# Patient Record
Sex: Female | Born: 1985 | Hispanic: Yes | Marital: Single | Smoking: Never smoker
Health system: Southern US, Community
[De-identification: ages and names within clinical notes are randomized; demographics above are authoritative.]

## PROBLEM LIST (undated history)

## (undated) DIAGNOSIS — Z789 Other specified health status: Secondary | ICD-10-CM

---

## 2007-05-08 ENCOUNTER — Inpatient Hospital Stay: Payer: Self-pay | Admitting: Obstetrics and Gynecology

## 2010-05-11 ENCOUNTER — Emergency Department: Payer: Self-pay | Admitting: Emergency Medicine

## 2010-06-21 ENCOUNTER — Emergency Department: Payer: Self-pay | Admitting: Emergency Medicine

## 2010-08-04 ENCOUNTER — Encounter: Payer: Self-pay | Admitting: Maternal and Fetal Medicine

## 2010-10-20 IMAGING — US US OB < 14 WEEKS
1 series · 17 of 28 positions shown · non-contrast
Comparison: None

REASON FOR EXAM: vaginal bleed
COMMENTS:   LMP: 11 weeks preg

PROCEDURE:     US  - US OB LESS THAN 14 WEEKS  - June 21, 2010  [DATE]
RESULT:     Indication: Vaginal bleeding. LMP 01/27/2010
TECHNIQUE: Multiple transabdominal gray-scale images  of the pelvis
performed.

[Series 1: us ob < 14 weeks · 17 of 40 slices shown]
[im 1/40]
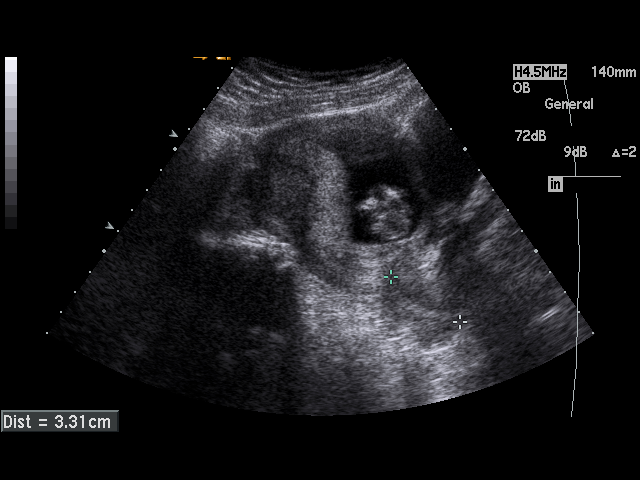
[im 3/40]
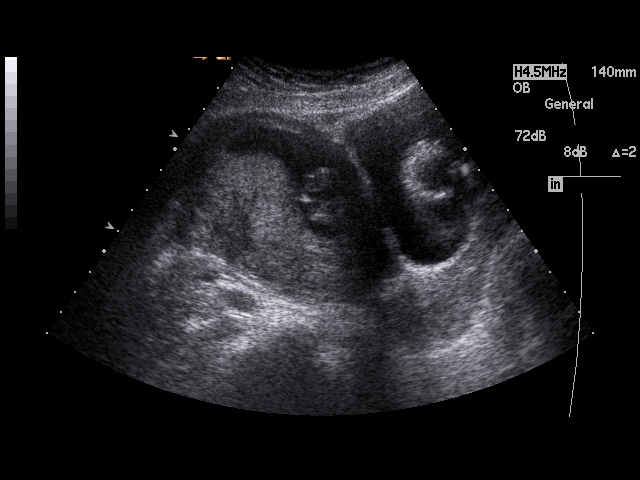
[im 6/40]
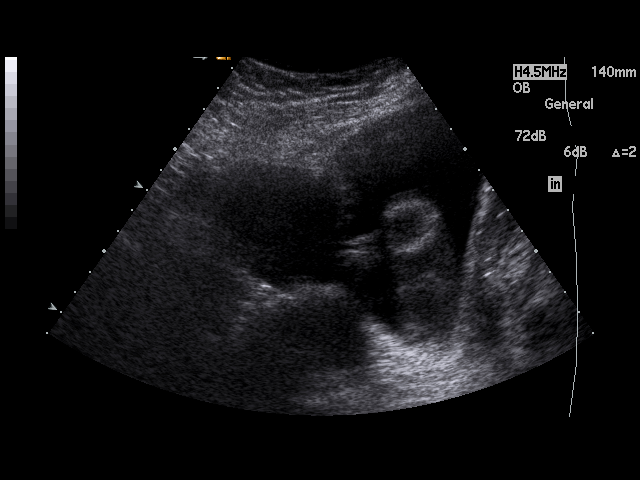
[im 8/40]
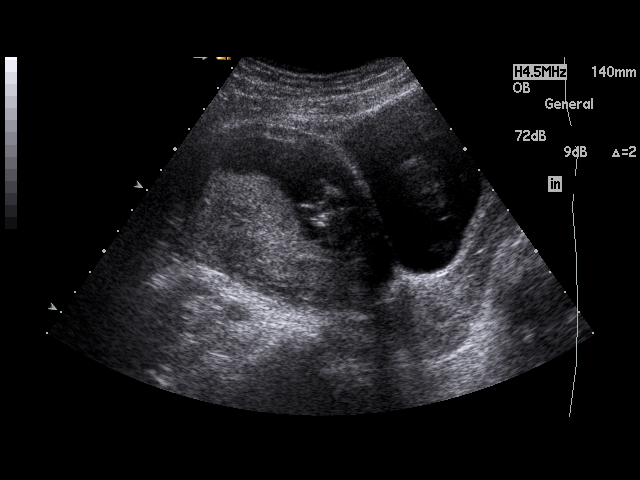
[im 11/40]
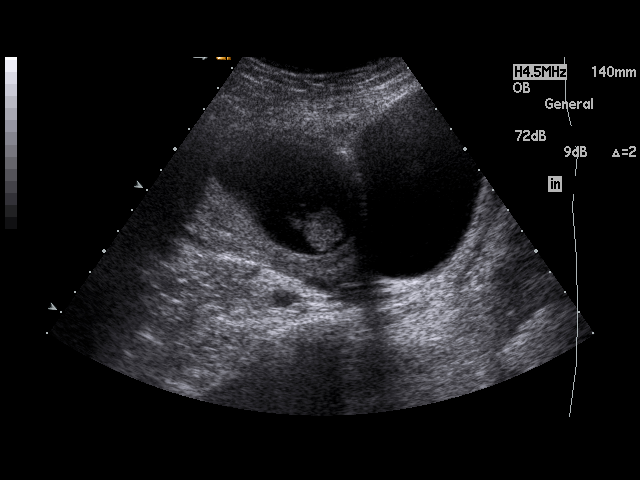
[im 14/40]
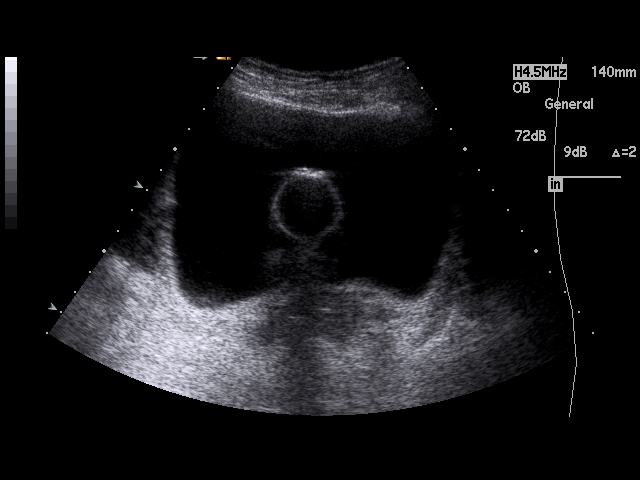
[im 15/40]
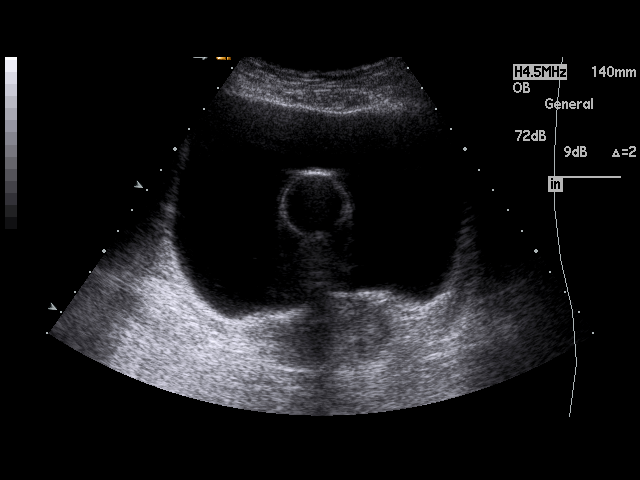
[im 18/40]
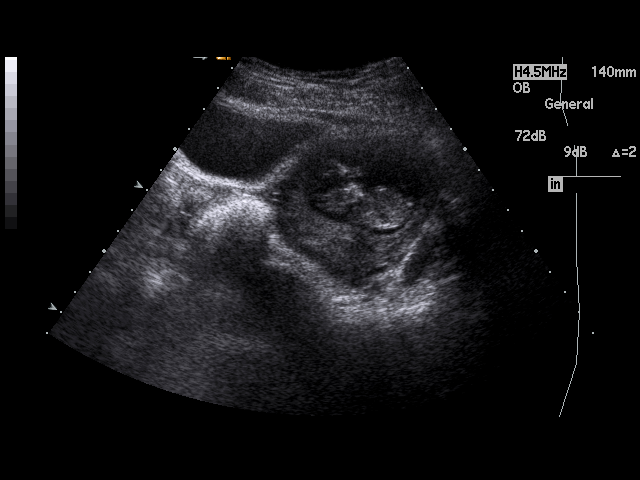
[im 21/40]
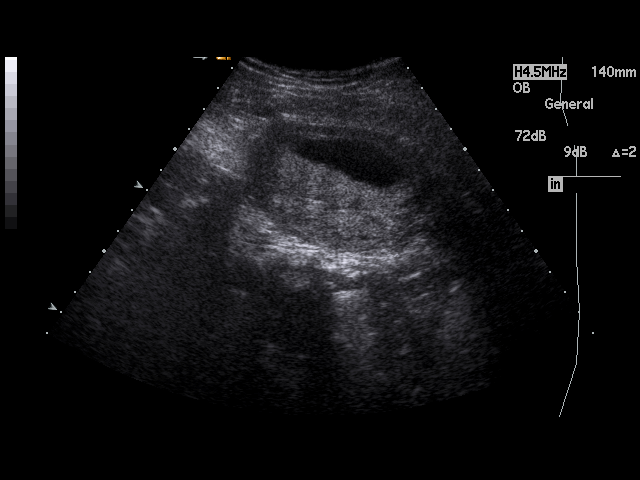
[im 22/40]
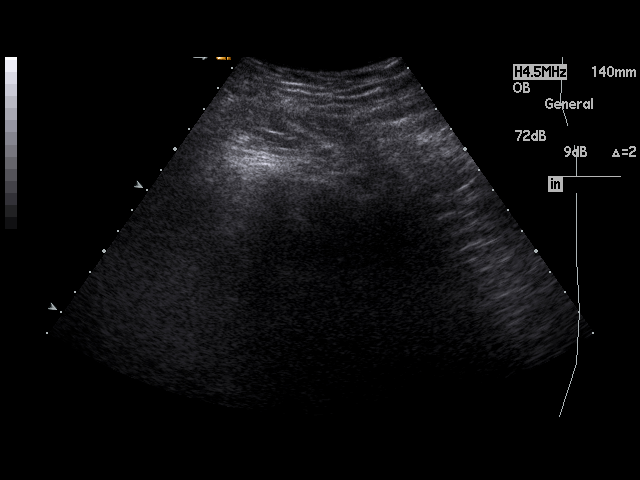
[im 25/40]
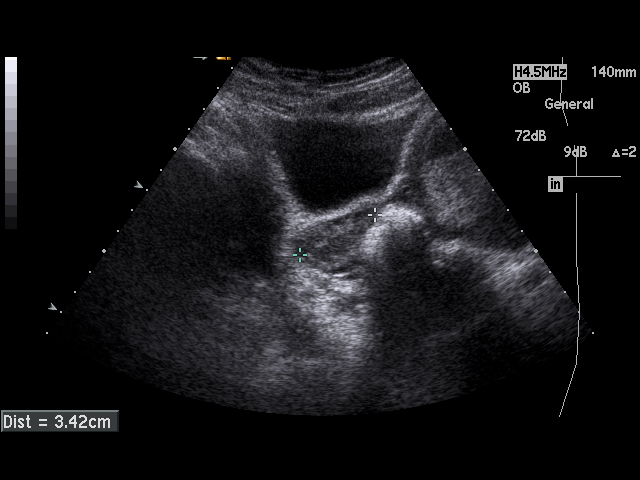
[im 27/40]
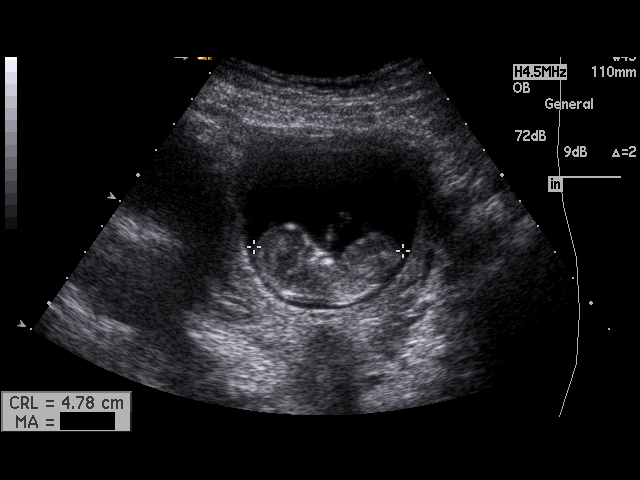
[im 29/40]
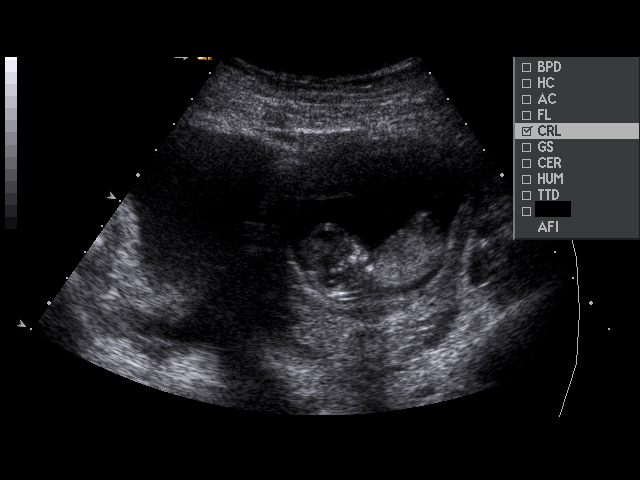
[im 32/40]
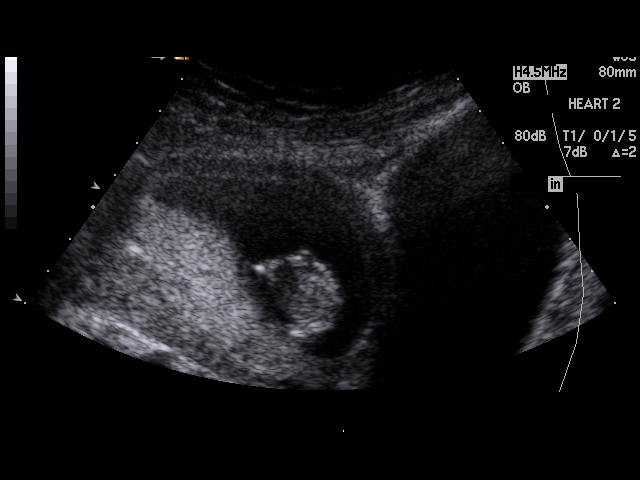
[im 34/40]
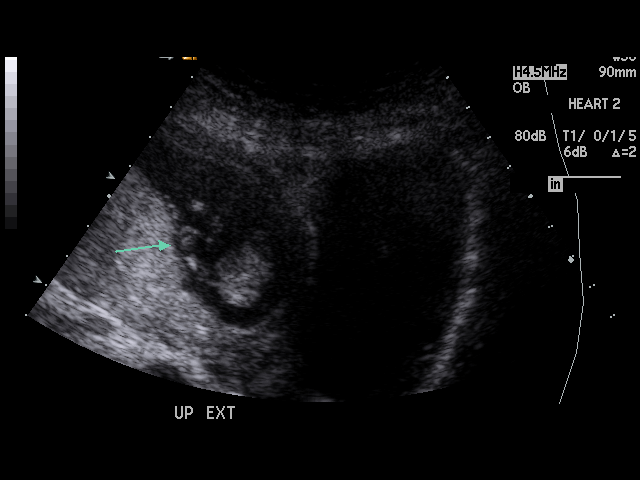
[im 37/40]
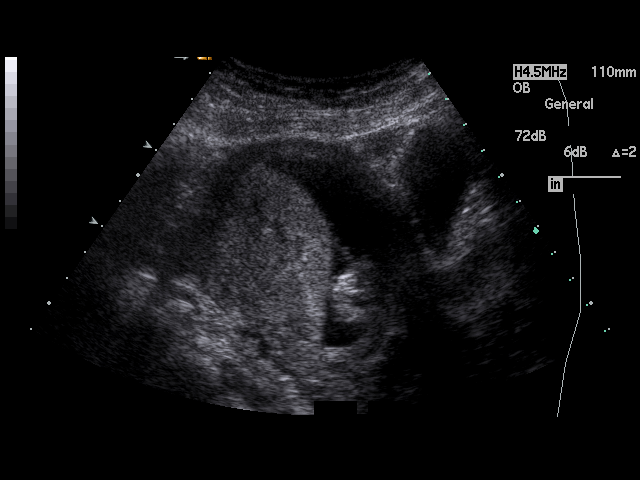
[im 40/40]
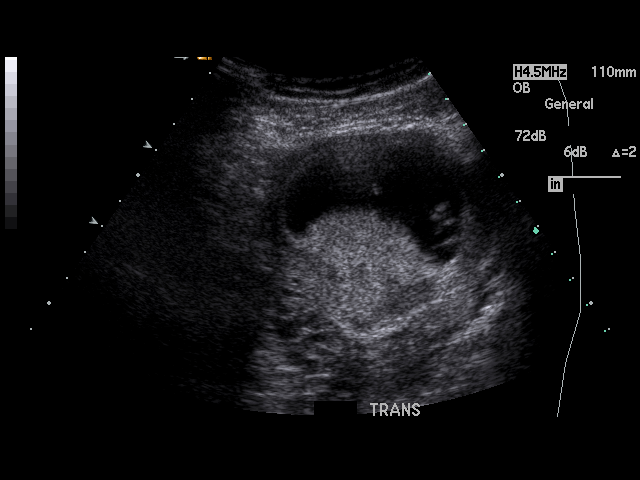

[17 of 28 positions shown; findings below may reference images not displayed]

FINDINGS: There is a single live intrauterine pregnancy visualized with a crown-rump
length of 4.71 cm dating the pregnancy at 11 weeks 3 days. There is a normal
yolk sac. There is a normal fetal heart rate of 163 beats per minute. The
cervix is normal in length measuring 3.4 cm and closed.

The right ovary measures 3.4 x 3.6 x 2.1 cm.  The left ovary measures 2.6 x
2.2 x 2.9 cm.  There is no adnexal mass.

There is no pelvic free fluid.
IMPRESSION: Single live intrauterine pregnancy dating 11 weeks 3 days with an estimated
due date of 01/07/2011.

## 2010-10-27 ENCOUNTER — Encounter: Payer: Self-pay | Admitting: Maternal and Fetal Medicine

## 2010-12-07 ENCOUNTER — Inpatient Hospital Stay: Payer: Self-pay

## 2016-07-28 ENCOUNTER — Encounter: Payer: Self-pay | Admitting: Emergency Medicine

## 2016-07-28 ENCOUNTER — Emergency Department
Admission: EM | Admit: 2016-07-28 | Discharge: 2016-07-28 | Disposition: A | Discharge status: Home / Self Care | Payer: BLUE CROSS/BLUE SHIELD | Attending: Emergency Medicine | Admitting: Emergency Medicine

## 2016-07-28 DIAGNOSIS — R300 Dysuria: Secondary | ICD-10-CM | POA: Diagnosis present

## 2016-07-28 DIAGNOSIS — N3000 Acute cystitis without hematuria: Secondary | ICD-10-CM | POA: Insufficient documentation

## 2016-07-28 LAB — URINALYSIS COMPLETE WITH MICROSCOPIC (ARMC ONLY)
Bilirubin Urine: NEGATIVE
Glucose, UA: NEGATIVE mg/dL
Ketones, ur: NEGATIVE mg/dL
NITRITE: NEGATIVE
PH: 6 (ref 5.0–8.0)
PROTEIN: 100 mg/dL — AB
SPECIFIC GRAVITY, URINE: 1.003 — AB (ref 1.005–1.030)

## 2016-07-28 LAB — POCT PREGNANCY, URINE: Preg Test, Ur: NEGATIVE

## 2016-07-28 MED ORDER — PHENAZOPYRIDINE HCL 100 MG PO TABS: 100.0000 mg | ORAL_TABLET | Freq: Three times a day (TID) | ORAL | 0 refills | Status: AC | PRN

## 2016-07-28 MED ORDER — PHENAZOPYRIDINE HCL 100 MG PO TABS
95.0000 mg | ORAL_TABLET | Freq: Once | ORAL | Status: AC
  Administered 2016-07-28: 100 mg via ORAL
  Filled 2016-07-28: qty 1

## 2016-07-28 MED ORDER — CIPROFLOXACIN HCL 500 MG PO TABS: 500.0000 mg | ORAL_TABLET | Freq: Two times a day (BID) | ORAL | 0 refills | Status: AC

## 2016-07-28 MED ORDER — CIPROFLOXACIN HCL 500 MG PO TABS
500.0000 mg | ORAL_TABLET | Freq: Once | ORAL | Status: AC
  Administered 2016-07-28: 500 mg via ORAL
  Filled 2016-07-28: qty 1

## 2016-07-28 NOTE — ED Notes (Signed)
Pt discharged to home.  Discharge instructions reviewed.  Verbalized understanding.  No questions or concerns at this time.  Teach back verified.  Pt in NAD.  No items left in ED.   

## 2016-07-28 NOTE — ED Triage Notes (Signed)
Patient ambulatory to triage with steady gait, without difficulty or distress noted; pt reports dysuria this evening

## 2016-07-28 NOTE — ED Provider Notes (Signed)
[facility] Emergency Department Provider Note    First MD Initiated Contact with Patient 07/28/16 [phone]     (approximate)  I have reviewed the triage vital signs and the nursing notes.   HISTORY  Chief Complaint Dysuria    HPI Monica Wilcox is a 30 y.o. female presents with dysuria or urinary frequency with onset yesterday evening. Patient denies any back pain no fever afebrile on presentation with temperature 98.1. Patient denies any vaginal discharge.   Past medical history None There are no active problems to display for this patient.   Past surgical history None  Prior to Admission medications   Not on File    Allergies Review of patient's allergies indicates no known allergies.  No family history on file.  Social History Social History  Substance Use Topics  . Smoking status: Never Smoker  . Smokeless tobacco: Never Used  . Alcohol use No    Review of Systems Constitutional: No fever/chills Eyes: No visual changes. ENT: No sore throat. Cardiovascular: Denies chest pain. Respiratory: Denies shortness of breath. Gastrointestinal: No abdominal pain.  No nausea, no vomiting.  No diarrhea.  No constipation. Genitourinary: Positive for dysuria. Musculoskeletal: Negative for back pain. Skin: Negative for rash. Neurological: Negative for headaches, focal weakness or numbness.  10-point ROS otherwise negative.  ____________________________________________   PHYSICAL EXAM:  VITAL SIGNS: ED Triage Vitals  Enc Vitals Group     BP 07/28/16 0124 140/85     Pulse Rate 07/28/16 0124 79     Resp 07/28/16 0124 18     Temp 07/28/16 0124 98.1 F (36.7 C)     Temp Source 07/28/16 0124 Oral     SpO2 07/28/16 0124 99 %     Weight 07/28/16 0123 132 lb (59.9 kg)     Height 07/28/16 0123 5' (1.524 m)     Head Circumference --      Peak Flow --      Pain Score 07/28/16 0123 9     Pain Loc --      Pain Edu? --      Excl. in [place]? --      Constitutional: Alert and oriented. Well appearing and in no acute distress. Eyes: Conjunctivae are normal. PERRL. EOMI. Head: Atraumatic. Mouth/Throat: Mucous membranes are moist.  Oropharynx non-erythematous. Cardiovascular: Normal rate, regular rhythm. Good peripheral circulation. Grossly normal heart sounds. Respiratory: Normal respiratory effort.  No retractions. Lungs CTAB. Gastrointestinal: Soft and nontender. No distention.  Musculoskeletal: No lower extremity tenderness nor edema. No gross deformities of extremities. Neurologic:  Normal speech and language. No gross focal neurologic deficits are appreciated.  Skin:  Skin is warm, dry and intact. No rash noted. Psychiatric: Mood and affect are normal. Speech and behavior are normal.  ____________________________________________   LABS (all labs ordered are listed, but only abnormal results are displayed)  Labs Reviewed  URINALYSIS COMPLETEWITH MICROSCOPIC ([facility] ONLY) - Abnormal; Notable for the following:       Result Value   Color, Urine YELLOW (*)    APPearance CLEAR (*)    Specific Gravity, Urine 1.003 (*)    Hgb urine dipstick 3+ (*)    Protein, ur 100 (*)    Leukocytes, UA 2+ (*)    Bacteria, UA RARE (*)    Squamous Epithelial / LPF 0-5 (*)    All other components within normal limits  POCT PREGNANCY, URINE    Procedures   INITIAL IMPRESSION / ASSESSMENT AND PLAN / ED COURSE  Pertinent labs & imaging results that were available during my care of the patient were reviewed by me and considered in my medical decision making (see chart for details).  Patient given Cipro in emergency Department will be prescribed some for home. In addition patient given Pyridium   Clinical Course    ____________________________________________  FINAL CLINICAL IMPRESSION(S) / ED DIAGNOSES  Final diagnoses:  Acute cystitis without hematuria     MEDICATIONS GIVEN DURING THIS VISIT:  Medications  ciprofloxacin  (CIPRO) tablet 500 mg (not administered)  phenazopyridine (PYRIDIUM) tablet 100 mg (not administered)     NEW OUTPATIENT MEDICATIONS STARTED DURING THIS VISIT:  New Prescriptions   No medications on file    Modified Medications   No medications on file    Discontinued Medications   No medications on file     Note:  This document was prepared using Dragon voice recognition software and may include unintentional dictation errors.    Darci Current, MD 07/28/16 [phone]

## 2017-09-06 ENCOUNTER — Encounter: Payer: Self-pay | Admitting: Urology

## 2017-09-06 ENCOUNTER — Ambulatory Visit: Payer: BLUE CROSS/BLUE SHIELD | Admitting: Urology

## 2017-09-06 VITALS — BP 127/81 | HR 58 | Ht 60.0 in | Wt 154.0 lb

## 2017-09-06 DIAGNOSIS — N390 Urinary tract infection, site not specified: Secondary | ICD-10-CM

## 2017-09-06 LAB — MICROSCOPIC EXAMINATION: WBC UA: NONE SEEN /HPF (ref 0–?)

## 2017-09-06 LAB — URINALYSIS, COMPLETE
Bilirubin, UA: NEGATIVE
GLUCOSE, UA: NEGATIVE
Ketones, UA: NEGATIVE
Leukocytes, UA: NEGATIVE
Nitrite, UA: NEGATIVE
PROTEIN UA: NEGATIVE
Specific Gravity, UA: 1.025 (ref 1.005–1.030)
UUROB: 0.2 mg/dL (ref 0.2–1.0)
pH, UA: 5.5 (ref 5.0–7.5)

## 2017-09-06 MED ORDER — MELOXICAM 15 MG PO TABS: 15.0000 mg | ORAL_TABLET | Freq: Every day | ORAL | 11 refills | Status: DC

## 2017-09-06 NOTE — Progress Notes (Signed)
09/06/2017 1:46 PM   Monica Wilcox 1985/12/25 [id number]  Referring provider: Christeen DouglasBeasley, [place], MD [address] [place], [place] [address]  Chief Complaint  Patient presents with  . Recurrent UTI    New Patient    HPI: I was consulted to assess the patient's 3820-month history of burning.  She describes frequency gross hematuria and a lot of pain with urination 2 months ago.  10 days afterwards she had burning and was treated for a yeast infection and and subsequently a bladder infection with antibiotics.  She still has dysuria.  She has dyspareunia new onset as well.  She voids every several hours and sometimes gets up once a night to urinate  She denies a history of kidney stones previous GU surgery and urinary tract infections.  She has not had a hysterectomy.  She has no neurologic issues.  Bowel movements are normal  Modifying factors: There are no other modifying factors  Associated signs and symptoms: There are no other associated signs and symptoms Aggravating and relieving factors: There are no other aggravating or relieving factors Severity: Moderate Duration: Persistent   PMH: History reviewed. No pertinent past medical history.  Surgical History: History reviewed. No pertinent surgical history.  Home Medications:  Allergies as of 09/06/2017   No Known Allergies     Medication List        Accurate as of 09/06/17  1:46 PM. Always use your most recent med list.          levonorgestrel 20 MCG/24HR IUD Commonly known as:  MIRENA by Intrauterine route.       Allergies: No Known Allergies  Family History: Family History  Problem Relation Age of Onset  . Bladder Cancer Neg Hx   . Kidney cancer Neg Hx     Social History:  reports that  has never smoked. she has never used smokeless tobacco. She reports that she does not drink alcohol or use drugs.  ROS: UROLOGY Frequent Urination?: No Hard to postpone urination?: No Burning/pain with urination?:  Yes Get up at night to urinate?: No Leakage of urine?: No Urine stream starts and stops?: No Trouble starting stream?: No Do you have to strain to urinate?: No Blood in urine?: Yes Urinary tract infection?: Yes Sexually transmitted disease?: No Injury to kidneys or bladder?: No Painful intercourse?: Yes Weak stream?: No Currently pregnant?: No Vaginal bleeding?: No Last menstrual period?: n  Gastrointestinal Nausea?: No Vomiting?: No Indigestion/heartburn?: No Diarrhea?: No Constipation?: No  Constitutional Fever: No Night sweats?: No Weight loss?: No Fatigue?: No  Skin Skin rash/lesions?: No Itching?: No  Eyes Blurred vision?: No Double vision?: No  Ears/Nose/Throat Sore throat?: No Sinus problems?: No  Hematologic/Lymphatic Swollen glands?: No Easy bruising?: No  Cardiovascular Leg swelling?: No Chest pain?: No  Respiratory Cough?: No Shortness of breath?: No  Endocrine Excessive thirst?: No  Musculoskeletal Back pain?: No Joint pain?: No  Neurological Headaches?: No Dizziness?: No  Psychologic Depression?: No Anxiety?: No  Physical Exam: BP 127/81 (BP Location: Right Arm, Patient Position: Sitting, Cuff Size: Normal)   Pulse (!) 58   Ht 5' (1.524 m)   Wt 154 lb (69.9 kg)   BMI 30.08 kg/m   Constitutional:  Alert and oriented, No acute distress. HEENT: [place] AT, moist mucus membranes.  Trachea midline, no masses. Cardiovascular: No clubbing, cyanosis, or edema. Respiratory: Normal respiratory effort, no increased work of breathing. GI: Abdomen is soft, nontender, nondistended, no abdominal masses GU: On pelvic examination the string  from the IUD appeared to be normal with no associated cervicitis or abnormality.  She had no abnormal vaginal discharge.  She had a little bit of burning when I touched her levator muscles and bladder.  She had no prolapse or stress incontinence Skin: No rashes, bruises or suspicious lesions. Lymph: No  cervical or inguinal adenopathy. Neurologic: Grossly intact, no focal deficits, moving all 4 extremities. Psychiatric: Normal mood and affect.  Laboratory Data:  Urinalysis    Component Value Date/Time   COLORURINE YELLOW (A) 07/28/2016 0127   APPEARANCEUR CLEAR (A) 07/28/2016 0127   LABSPEC 1.003 (L) 07/28/2016 0127   PHURINE 6.0 07/28/2016 0127   GLUCOSEU NEGATIVE 07/28/2016 0127   HGBUR 3+ (A) 07/28/2016 0127   BILIRUBINUR NEGATIVE 07/28/2016 0127   KETONESUR NEGATIVE 07/28/2016 0127   PROTEINUR 100 (A) 07/28/2016 0127   NITRITE NEGATIVE 07/28/2016 0127   LEUKOCYTESUR 2+ (A) 07/28/2016 0127    Pertinent Imaging: none  Assessment & Plan: With a history of hematuria the patient may have a post urinary tract infection hypersensitivity with dysuria.  I think it very reasonable to have the patient evaluated with a hematuria CT scan recognizing she is only 31 and perform cystoscopy.  I think it is too early to consider interstitial cystitis.  She may have a urethritis.  Placing the patient on prophylaxis and bladder irritant diet would be prudent and reevaluate.  I do not recommend a hydrodistention yet.  She had no microscopic hematuria today.  I will have her initially seen by 1 of my partners to expedite her visit.  I did discuss post urinary tract infection hypersensitivity and urethritis and mentioned interstitial cystitis today.  Again recognizing sometimes its limitations I gave her meloxicam 15 mg a day for 14 days.  1. Recurrent UTI Chronic cystitis - Urinalysis, Complete   No Follow-up on file.  Martina SinnerMACDIARMID,Ariston Grandison A, MD  [facility] [address], [address] [place], [place] [address] [phone]

## 2017-09-08 LAB — CULTURE, URINE COMPREHENSIVE

## 2017-09-13 ENCOUNTER — Ambulatory Visit: Payer: Self-pay

## 2017-09-15 ENCOUNTER — Ambulatory Visit
Admission: RE | Admit: 2017-09-15 | Discharge: 2017-09-15 | Disposition: A | Discharge status: Home / Self Care | Payer: BLUE CROSS/BLUE SHIELD | Source: Ambulatory Visit | Attending: Urology | Admitting: Urology

## 2017-09-15 DIAGNOSIS — M5137 Other intervertebral disc degeneration, lumbosacral region: Secondary | ICD-10-CM | POA: Insufficient documentation

## 2017-09-15 DIAGNOSIS — N390 Urinary tract infection, site not specified: Secondary | ICD-10-CM | POA: Diagnosis present

## 2017-09-15 DIAGNOSIS — M5136 Other intervertebral disc degeneration, lumbar region: Secondary | ICD-10-CM | POA: Insufficient documentation

## 2017-09-15 DIAGNOSIS — M4185 Other forms of scoliosis, thoracolumbar region: Secondary | ICD-10-CM | POA: Insufficient documentation

## 2017-09-15 MED ORDER — IOPAMIDOL (ISOVUE-300) INJECTION 61%
125.0000 mL | Freq: Once | INTRAVENOUS | Status: AC | PRN
  Administered 2017-09-15: 125 mL via INTRAVENOUS

## 2017-09-16 ENCOUNTER — Ambulatory Visit: Payer: BLUE CROSS/BLUE SHIELD

## 2017-09-30 ENCOUNTER — Ambulatory Visit (INDEPENDENT_AMBULATORY_CARE_PROVIDER_SITE_OTHER): Payer: BLUE CROSS/BLUE SHIELD | Admitting: Urology

## 2017-09-30 ENCOUNTER — Encounter: Payer: Self-pay | Admitting: Urology

## 2017-09-30 VITALS — BP 126/70 | HR 67 | Ht 60.0 in | Wt 154.0 lb

## 2017-09-30 DIAGNOSIS — R3129 Other microscopic hematuria: Secondary | ICD-10-CM | POA: Diagnosis not present

## 2017-09-30 LAB — MICROSCOPIC EXAMINATION: WBC, UA: NONE SEEN /hpf (ref 0–?)

## 2017-09-30 LAB — URINALYSIS, COMPLETE
Bilirubin, UA: NEGATIVE
GLUCOSE, UA: NEGATIVE
Ketones, UA: NEGATIVE
LEUKOCYTES UA: NEGATIVE
Nitrite, UA: NEGATIVE
PH UA: 6.5 (ref 5.0–7.5)
PROTEIN UA: NEGATIVE
Specific Gravity, UA: 1.02 (ref 1.005–1.030)
UUROB: 0.2 mg/dL (ref 0.2–1.0)

## 2017-09-30 MED ORDER — SOLIFENACIN SUCCINATE 5 MG PO TABS: 5.0000 mg | ORAL_TABLET | Freq: Every day | ORAL | 2 refills | Status: DC

## 2017-09-30 MED ORDER — LIDOCAINE HCL 2 % EX GEL
1.0000 "application " | Freq: Once | CUTANEOUS | Status: AC
  Administered 2017-09-30: 1 via URETHRAL

## 2017-09-30 MED ORDER — CIPROFLOXACIN HCL 500 MG PO TABS
500.0000 mg | ORAL_TABLET | Freq: Once | ORAL | Status: AC
  Administered 2017-09-30: 500 mg via ORAL

## 2017-09-30 NOTE — Progress Notes (Signed)
   09/30/17  CC:  Chief Complaint  Patient presents with  . Cysto    HPI: Refer to Dr. Mina MarbleMacDiarmid's note of 09/06/2017.  She saw no improvement on meloxicam.  CT urogram shows no significant genitourinary abnormalities.  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 Blood pressure 126/70, pulse 67, height 5' (1.524 m), weight 154 lb (69.9 kg). NED. A&Ox3.   No respiratory distress   Abd soft, NT, ND Normal external genitalia with patent urethral meatus  Cystoscopy Procedure Note  Patient identification was confirmed, informed consent was obtained, and patient was prepped using Betadine solution.  Lidocaine jelly was administered per urethral meatus.    Preoperative abx where received prior to procedure.    Procedure: - Flexible cystoscope introduced, without any difficulty.   - Thorough search of the bladder revealed:    normal urethral meatus    normal urothelium    no stones    no ulcers     no tumors    no urethral polyps    no trabeculation  - Ureteral orifices were normal in position and appearance.  Post-Procedure: - Patient tolerated the procedure well  Assessment/ Plan: No significant abnormalities noted on cystoscopy.  Her pain has improved although she still has bothersome storage related symptoms.  Trial of Vesicare 5 mg daily.   Riki AltesScott C Loula Marcella, MD

## 2017-11-25 ENCOUNTER — Ambulatory Visit: Payer: BLUE CROSS/BLUE SHIELD | Admitting: Urology

## 2017-11-29 ENCOUNTER — Ambulatory Visit: Payer: BLUE CROSS/BLUE SHIELD | Admitting: Urology

## 2019-03-21 ENCOUNTER — Other Ambulatory Visit: Payer: BLUE CROSS/BLUE SHIELD

## 2019-03-21 ENCOUNTER — Telehealth: Payer: Self-pay | Admitting: *Deleted

## 2019-03-21 DIAGNOSIS — Z20822 Contact with and (suspected) exposure to covid-19: Secondary | ICD-10-CM

## 2019-03-21 NOTE — Telephone Encounter (Signed)
Pt shceduled for covid testing at [facility] site for today. Requested by [facility] Dept, pt with possible exposure.  Testing process reviewed with pt, verbalizes understanding.

## 2019-03-23 LAB — NOVEL CORONAVIRUS, NAA: SARS-CoV-2, NAA: NOT DETECTED

## 2019-03-27 ENCOUNTER — Telehealth: Payer: Self-pay | Admitting: *Deleted

## 2019-03-27 NOTE — Telephone Encounter (Signed)
Copied from [place] [id number]. Topic: General - Inquiry >> Mar 24, 2019 11:53 AM Mathis Bud wrote: Reason for CRM: patient is requesting a return to work letter due to her covid test being negative. Patient would like letter in [place].  Call back # [phone]

## 2019-03-27 NOTE — Telephone Encounter (Signed)
Attempted to call pt. To discuss need for note to return to work.  Unable to reach pt. At this time.  Left vm to call [phone] to further discuss, as no return to work note is available.  Pt. will need an appt. to establish care with a PCP to receive return to work note.

## 2019-06-29 ENCOUNTER — Other Ambulatory Visit: Payer: Self-pay

## 2019-06-29 DIAGNOSIS — Z20822 Contact with and (suspected) exposure to covid-19: Secondary | ICD-10-CM

## 2019-06-30 LAB — NOVEL CORONAVIRUS, NAA: SARS-CoV-2, NAA: DETECTED — AB

## 2019-10-06 NOTE — L&D Delivery Note (Signed)
Delivery Note  Date of delivery: 04/20/2020 Estimated Date of Delivery: 04/28/20 No LMP recorded (lmp unknown). EGA: [redacted]w[redacted]d  Delivery Note At 5:49 AM a viable female was delivered via Vaginal, Spontaneous (Presentation: OA).  APGAR: 9, 9; weight  pending.  Placenta status: Spontaneous, Intact.  Cord: 3 vessels with the following complications: None.  Cord pH: n/a  Anesthesia: None Episiotomy: None Lacerations: 1st degree Suture Repair: 3.0 vicryl Est. Blood Loss (mL): 125 +275  First Stage: Labor onset: 0400 Augmentation : none Analgesia /Anesthesia intrapartum: none SROM at 0420  Monica Wilcox presented to L&D with UCs. She was expectantly managed.    Second Stage: Complete dilation at 0545 Onset of pushing at 0545 FHR second stage Cat I Delivery at 0549 on 04/20/2020  She progressed to complete and had a spontaneous vaginal birth of a live female over an intact perineum. The fetal head was delivered in OA position with restitution to LOA. No nuchal cord. Anterior then posterior shoulders delivered spontaneously. Baby placed on mom's abdomen and attended to by transition RN. Cord clamped and cut when pulseless by FOB. Cord blood obtained for newborn labs.  Third Stage: Placenta delivered spontaneously intact with 3VC at 0558 Placenta disposition: Pathology Uterine tone firm / bleeding mod IV pitocin given for hemorrhage prophylaxis  1st degree laceration identified  Anesthesia for repair: Lidocaine Repair 3.0 vicryl Est. Blood Loss (mL): 400  Complications: MSAF  Mom to postpartum.  Baby to Couplet care / Skin to Skin.  Newborn: Birth Weight: pending  Apgar Scores: 9, 9 Feeding planned: Both   Cyril Mourning, CNM 04/20/2020 6:20 AM

## 2019-10-18 ENCOUNTER — Other Ambulatory Visit: Payer: Self-pay

## 2019-10-18 ENCOUNTER — Emergency Department: Payer: Medicaid Other

## 2019-10-18 ENCOUNTER — Emergency Department
Admission: EM | Admit: 2019-10-18 | Discharge: 2019-10-20 | Disposition: A | Discharge status: Home / Self Care | Payer: Medicaid Other | Attending: Emergency Medicine | Admitting: Emergency Medicine

## 2019-10-18 ENCOUNTER — Encounter: Payer: Self-pay | Admitting: *Deleted

## 2019-10-18 DIAGNOSIS — Z3A12 12 weeks gestation of pregnancy: Secondary | ICD-10-CM | POA: Diagnosis not present

## 2019-10-18 DIAGNOSIS — O209 Hemorrhage in early pregnancy, unspecified: Secondary | ICD-10-CM | POA: Insufficient documentation

## 2019-10-18 DIAGNOSIS — R102 Pelvic and perineal pain: Secondary | ICD-10-CM | POA: Insufficient documentation

## 2019-10-18 DIAGNOSIS — R109 Unspecified abdominal pain: Secondary | ICD-10-CM

## 2019-10-18 LAB — CBC WITH DIFFERENTIAL/PLATELET
Abs Immature Granulocytes: 0.1 10*3/uL — ABNORMAL HIGH (ref 0.00–0.07)
Basophils Absolute: 0 10*3/uL (ref 0.0–0.1)
Basophils Relative: 0 %
Eosinophils Absolute: 0.2 10*3/uL (ref 0.0–0.5)
Eosinophils Relative: 2 %
HCT: 34.9 % — ABNORMAL LOW (ref 36.0–46.0)
Hemoglobin: 12 g/dL (ref 12.0–15.0)
Immature Granulocytes: 1 %
Lymphocytes Relative: 24 %
Lymphs Abs: 2.4 10*3/uL (ref 0.7–4.0)
MCH: 28.9 pg (ref 26.0–34.0)
MCHC: 34.4 g/dL (ref 30.0–36.0)
MCV: 84.1 fL (ref 80.0–100.0)
Monocytes Absolute: 0.8 10*3/uL (ref 0.1–1.0)
Monocytes Relative: 8 %
Neutro Abs: 6.5 10*3/uL (ref 1.7–7.7)
Neutrophils Relative %: 65 %
Platelets: 234 10*3/uL (ref 150–400)
RBC: 4.15 MIL/uL (ref 3.87–5.11)
RDW: 12.2 % (ref 11.5–15.5)
WBC: 10 10*3/uL (ref 4.0–10.5)
nRBC: 0 % (ref 0.0–0.2)

## 2019-10-18 LAB — ABO/RH: ABO/RH(D): O POS

## 2019-10-18 LAB — URINALYSIS, COMPLETE (UACMP) WITH MICROSCOPIC
Bilirubin Urine: NEGATIVE
Glucose, UA: NEGATIVE mg/dL
Ketones, ur: NEGATIVE mg/dL
Leukocytes,Ua: NEGATIVE
Nitrite: NEGATIVE
Protein, ur: NEGATIVE mg/dL
Specific Gravity, Urine: 1.004 — ABNORMAL LOW (ref 1.005–1.030)
pH: 6 (ref 5.0–8.0)

## 2019-10-18 LAB — HCG, QUANTITATIVE, PREGNANCY: hCG, Beta Chain, Quant, S: 76881 m[IU]/mL — ABNORMAL HIGH (ref <5)

## 2019-10-18 LAB — POCT PREGNANCY, URINE: Preg Test, Ur: POSITIVE — AB

## 2019-10-18 LAB — BASIC METABOLIC PANEL
Anion gap: 9 (ref 5–15)
BUN: 9 mg/dL (ref 6–20)
CO2: 25 mmol/L (ref 22–32)
Calcium: 9.5 mg/dL (ref 8.9–10.3)
Chloride: 102 mmol/L (ref 98–111)
Creatinine, Ser: 0.54 mg/dL (ref 0.44–1.00)
GFR calc Af Amer: >60 mL/min (ref >60)
GFR calc non Af Amer: >60 mL/min (ref >60)
Glucose, Bld: 93 mg/dL (ref 70–99)
Potassium: 3.4 mmol/L — ABNORMAL LOW (ref 3.5–5.1)
Sodium: 136 mmol/L (ref 135–145)

## 2019-10-18 NOTE — ED Triage Notes (Signed)
Pt ambulatory to triage.  Pt has abd pain with vag bleeding x 1 day, pt is [redacted] weeks pregnant.  No urinary sx.  Pt alert  Speech clear.

## 2019-10-19 NOTE — ED Provider Notes (Signed)
[facility] Emergency Department Provider Note  ____________________________________________   First MD Initiated Contact with Patient 10/18/19 2345     (approximate)  I have reviewed the triage vital signs and the nursing notes.   HISTORY  Chief Complaint Vaginal Bleeding and Routine Prenatal Visit    HPI Monica Wilcox is a 34 y.o. female G3 P2 approximately [redacted] weeks pregnant presents to the emergency department secondary to pelvic discomfort and vaginal bleeding which patient states she has had 2 episodes noted today.  Second episode was worse than the first she states but still spotting.  Patient denies any pain at present.        No past medical history on file.  There are no problems to display for this patient.   No past surgical history on file.  Prior to Admission medications   Medication Sig Start Date End Date Taking? Authorizing Provider  levonorgestrel (MIRENA) 20 MCG/24HR IUD by Intrauterine route.    [provider]  meloxicam (MOBIC) 15 MG tablet Take 1 tablet (15 mg total) by mouth daily. 09/06/17   Bjorn Loser, MD  solifenacin (VESICARE) 5 MG tablet Take 1 tablet (5 mg total) by mouth daily. 09/30/17   Stoioff, Ronda Fairly, MD    Allergies Patient has no known allergies.  Family History  Problem Relation Age of Onset  . Bladder Cancer Neg Hx   . Kidney cancer Neg Hx     Social History Social History   Tobacco Use  . Smoking status: Never Smoker  . Smokeless tobacco: Never Used  Substance Use Topics  . Alcohol use: No  . Drug use: No    Review of Systems Constitutional: No fever/chills Eyes: No visual changes. ENT: No sore throat. Cardiovascular: Denies chest pain. Respiratory: Denies shortness of breath. Gastrointestinal: No abdominal pain.  No nausea, no vomiting.  No diarrhea.  No constipation. Genitourinary: Positive for pelvic discomfort and vaginal bleeding. Musculoskeletal: Negative for neck  pain.  Negative for back pain. Integumentary: Negative for rash. Neurological: Negative for headaches, focal weakness or numbness.   ____________________________________________   PHYSICAL EXAM:  VITAL SIGNS: ED Triage Vitals  Enc Vitals Group     BP 10/18/19 1920 123/66     Pulse Rate 10/18/19 1920 69     Resp 10/18/19 1920 20     Temp 10/18/19 1920 98.7 F (37.1 C)     Temp Source 10/18/19 1920 Oral     SpO2 10/18/19 1920 100 %     Weight 10/18/19 1922 77.1 kg (170 lb)     Height 10/18/19 1922 1.626 m (5\' 4" )     Head Circumference --      Peak Flow --      Pain Score 10/18/19 1921 8     Pain Loc --      Pain Edu? --      Excl. in [place]? --     Constitutional: Alert and oriented.  Eyes: Conjunctivae are normal.  Mouth/Throat: Patient is wearing a mask. Neck: No stridor.  No meningeal signs.   Cardiovascular: Normal rate, regular rhythm. Good peripheral circulation. Grossly normal heart sounds. Respiratory: Normal respiratory effort.  No retractions. Gastrointestinal: Soft and nontender. No distention.  Genitourinary: Mild mount of blood noted in vagina cervical os is closed. Musculoskeletal: No lower extremity tenderness nor edema. No gross deformities of extremities. Neurologic:  Normal speech and language. No gross focal neurologic deficits are appreciated.  Skin:  Skin is warm, dry and intact.  ____________________________________________   LABS (all labs ordered are listed, but only abnormal results are displayed)  Labs Reviewed  CBC WITH DIFFERENTIAL/PLATELET - Abnormal; Notable for the following components:      Result Value   HCT 34.9 (*)    Abs Immature Granulocytes 0.10 (*)    All other components within normal limits  BASIC METABOLIC PANEL - Abnormal; Notable for the following components:   Potassium 3.4 (*)    All other components within normal limits  URINALYSIS, COMPLETE (UACMP) WITH MICROSCOPIC - Abnormal; Notable for the following components:    Color, Urine STRAW (*)    APPearance CLEAR (*)    Specific Gravity, Urine 1.004 (*)    Hgb urine dipstick SMALL (*)    Bacteria, UA RARE (*)    All other components within normal limits  HCG, QUANTITATIVE, PREGNANCY - Abnormal; Notable for the following components:   hCG, Beta Chain, Quant, S 42,595 (*)    All other components within normal limits  POCT PREGNANCY, URINE - Abnormal; Notable for the following components:   Preg Test, Ur POSITIVE (*)    All other components within normal limits  ABO/RH   _______________________________  RADIOLOGY I, [place] Sherita Decoste, personally viewed and evaluated these images (plain radiographs) as part of my medical decision making, as well as reviewing the written report by the radiologist.  ED MD interpretation: 12 weeks, 6 days intrauterine pregnancy with a fetal heart rate of 153 with no gross abnormality noted per radiologist on ultrasound interpretation.  Official radiology report(s): US OB Comp Less 14 Wks  Result Date: 10/18/2019 CLINICAL DATA:  Lower abdominal pain, vaginal bleeding EXAM: OBSTETRIC <14 WK ULTRASOUND TECHNIQUE: Transabdominal ultrasound was performed for evaluation of the gestation as well as the maternal uterus and adnexal regions. COMPARISON:  None. FINDINGS: Intrauterine gestational sac: Single Yolk sac:  Not visualized Embryo:  Visualized Cardiac Activity: Visualized Heart Rate: 153 bpm MSD:    mm    w     d CRL:   65 mm   12 w 6 d                  [place] EDC: 04/25/2020 Subchorionic hemorrhage:  None visualized. Maternal uterus/adnexae: No adnexal mass or free fluid. Right corpus luteal cyst. IMPRESSION: Twelve week 6 day intrauterine pregnancy. Fetal heart rate 153 beats per minute. No acute maternal findings. Electronically Signed   By: Charlett Nose M.D.   On: 10/18/2019 23:21    :  Procedures   ____________________________________________   INITIAL IMPRESSION / MDM / ASSESSMENT AND PLAN / ED COURSE  As part of my  medical decision making, I reviewed the following data within the electronic MEDICAL RECORD NUMBER   34 year old female presented with above-stated history and physical exam secondary to pelvic pain and vaginal bleeding during pregnancy.  Differential diagnosis include but not limited to ectopic pregnancy, threatened miscarriage versus miscarriage, bleeding in the first trimester.  Ultrasound revealed no gross abnormality patient's blood type is O+.  ____________________________________________  FINAL CLINICAL IMPRESSION(S) / ED DIAGNOSES  Final diagnoses:  Abdominal pain  First trimester bleeding     MEDICATIONS GIVEN DURING THIS VISIT:  Medications - No data to display   ED Discharge Orders    None      *Please note:  Monica Wilcox was evaluated in Emergency Department on 10/19/2019 for the symptoms described in the history of present illness. She was evaluated in the context of the global COVID-19 pandemic, which necessitated  consideration that the patient might be at risk for infection with the SARS-CoV-2 virus that causes COVID-19. Institutional protocols and algorithms that pertain to the evaluation of patients at risk for COVID-19 are in a state of rapid change based on information released by regulatory bodies including the CDC and federal and state organizations. These policies and algorithms were followed during the patient's care in the ED.  Some ED evaluations and interventions may be delayed as a result of limited staffing during the pandemic.*  Note:  This document was prepared using Dragon voice recognition software and may include unintentional dictation errors.   Darci Current, MD 10/19/19 [phone]

## 2020-04-20 ENCOUNTER — Inpatient Hospital Stay
Admission: EM | Admit: 2020-04-20 | Discharge: 2020-04-22 | DRG: 806 | Disposition: A | Discharge status: Home / Self Care | Payer: Medicaid Other | Attending: Obstetrics and Gynecology | Admitting: Obstetrics and Gynecology

## 2020-04-20 ENCOUNTER — Other Ambulatory Visit: Payer: Self-pay

## 2020-04-20 ENCOUNTER — Encounter: Payer: Self-pay | Admitting: Obstetrics and Gynecology

## 2020-04-20 DIAGNOSIS — K5900 Constipation, unspecified: Secondary | ICD-10-CM | POA: Diagnosis present

## 2020-04-20 DIAGNOSIS — Z3A38 38 weeks gestation of pregnancy: Secondary | ICD-10-CM | POA: Diagnosis not present

## 2020-04-20 DIAGNOSIS — D62 Acute posthemorrhagic anemia: Secondary | ICD-10-CM | POA: Diagnosis not present

## 2020-04-20 DIAGNOSIS — O9081 Anemia of the puerperium: Secondary | ICD-10-CM | POA: Diagnosis not present

## 2020-04-20 DIAGNOSIS — O479 False labor, unspecified: Secondary | ICD-10-CM | POA: Diagnosis present

## 2020-04-20 DIAGNOSIS — O26893 Other specified pregnancy related conditions, third trimester: Secondary | ICD-10-CM | POA: Diagnosis present

## 2020-04-20 DIAGNOSIS — Z20822 Contact with and (suspected) exposure to covid-19: Secondary | ICD-10-CM | POA: Diagnosis present

## 2020-04-20 HISTORY — DX: Other specified health status: Z78.9

## 2020-04-20 LAB — TYPE AND SCREEN
ABO/RH(D): O POS
Antibody Screen: NEGATIVE

## 2020-04-20 LAB — CBC
HCT: 42 % (ref 36.0–46.0)
Hemoglobin: 14.3 g/dL (ref 12.0–15.0)
MCH: 28.1 pg (ref 26.0–34.0)
MCHC: 34 g/dL (ref 30.0–36.0)
MCV: 82.7 fL (ref 80.0–100.0)
Platelets: 245 10*3/uL (ref 150–400)
RBC: 5.08 MIL/uL (ref 3.87–5.11)
RDW: 13.5 % (ref 11.5–15.5)
WBC: 9.9 10*3/uL (ref 4.0–10.5)
nRBC: 0 % (ref 0.0–0.2)

## 2020-04-20 LAB — COMPREHENSIVE METABOLIC PANEL
ALT: 10 U/L (ref 0–44)
AST: 18 U/L (ref 15–41)
Albumin: 3.5 g/dL (ref 3.5–5.0)
Alkaline Phosphatase: 144 U/L — ABNORMAL HIGH (ref 38–126)
Anion gap: 10 (ref 5–15)
BUN: 6 mg/dL (ref 6–20)
CO2: 20 mmol/L — ABNORMAL LOW (ref 22–32)
Calcium: 9.3 mg/dL (ref 8.9–10.3)
Chloride: 106 mmol/L (ref 98–111)
Creatinine, Ser: 0.56 mg/dL (ref 0.44–1.00)
GFR calc Af Amer: >60 mL/min (ref >60)
GFR calc non Af Amer: >60 mL/min (ref >60)
Glucose, Bld: 92 mg/dL (ref 70–99)
Potassium: 3.5 mmol/L (ref 3.5–5.1)
Sodium: 136 mmol/L (ref 135–145)
Total Bilirubin: 0.8 mg/dL (ref 0.3–1.2)
Total Protein: 7 g/dL (ref 6.5–8.1)

## 2020-04-20 LAB — RPR: RPR Ser Ql: NONREACTIVE

## 2020-04-20 LAB — SARS CORONAVIRUS 2 BY RT PCR (HOSPITAL ORDER, PERFORMED IN ~~LOC~~ HOSPITAL LAB): SARS Coronavirus 2: NEGATIVE

## 2020-04-20 MED ORDER — IBUPROFEN 600 MG PO TABS
ORAL_TABLET | ORAL | Status: AC
  Filled 2020-04-20: qty 1

## 2020-04-20 MED ORDER — BENZOCAINE-MENTHOL 20-0.5 % EX AERO: 1.0000 "application " | INHALATION_SPRAY | CUTANEOUS | Status: DC | PRN

## 2020-04-20 MED ORDER — LIDOCAINE HCL (PF) 1 % IJ SOLN
INTRAMUSCULAR | Status: AC
  Filled 2020-04-20: qty 30

## 2020-04-20 MED ORDER — OXYTOCIN BOLUS FROM INFUSION
333.0000 mL | Freq: Once | INTRAVENOUS | Status: DC
  Administered 2020-04-20: 333 mL via INTRAVENOUS

## 2020-04-20 MED ORDER — ACETAMINOPHEN 325 MG PO TABS
ORAL_TABLET | ORAL | Status: AC
  Filled 2020-04-20: qty 2

## 2020-04-20 MED ORDER — OXYTOCIN-SODIUM CHLORIDE 30-0.9 UT/500ML-% IV SOLN
INTRAVENOUS | Status: AC
  Filled 2020-04-20: qty 500

## 2020-04-20 MED ORDER — MISOPROSTOL 200 MCG PO TABS
ORAL_TABLET | ORAL | Status: AC
  Filled 2020-04-20: qty 4

## 2020-04-20 MED ORDER — OXYTOCIN-SODIUM CHLORIDE 30-0.9 UT/500ML-% IV SOLN
2.5000 [IU]/h | INTRAVENOUS | Status: DC
  Administered 2020-04-20: 2.5 [IU]/h via INTRAVENOUS
  Filled 2020-04-20: qty 1000

## 2020-04-20 MED ORDER — LACTATED RINGERS IV SOLN: INTRAVENOUS | Status: DC

## 2020-04-20 MED ORDER — DOCUSATE SODIUM 100 MG PO CAPS
100.0000 mg | ORAL_CAPSULE | Freq: Two times a day (BID) | ORAL | Status: DC
  Administered 2020-04-20 – 2020-04-21 (×3): 100 mg via ORAL
  Filled 2020-04-20 (×3): qty 1

## 2020-04-20 MED ORDER — ACETAMINOPHEN 325 MG PO TABS: 650.0000 mg | ORAL_TABLET | ORAL | Status: DC | PRN

## 2020-04-20 MED ORDER — ONDANSETRON HCL 4 MG/2ML IJ SOLN: 4.0000 mg | INTRAMUSCULAR | Status: DC | PRN

## 2020-04-20 MED ORDER — WITCH HAZEL-GLYCERIN EX PADS: 1.0000 "application " | MEDICATED_PAD | CUTANEOUS | Status: DC | PRN

## 2020-04-20 MED ORDER — SODIUM CHLORIDE 0.9 % IV SOLN
2.0000 g | Freq: Once | INTRAVENOUS | Status: AC
  Administered 2020-04-20: 2 g via INTRAVENOUS
  Filled 2020-04-20: qty 2000

## 2020-04-20 MED ORDER — COCONUT OIL OIL
1.0000 "application " | TOPICAL_OIL | Status: DC | PRN
  Filled 2020-04-20: qty 120

## 2020-04-20 MED ORDER — DIPHENHYDRAMINE HCL 25 MG PO CAPS: 25.0000 mg | ORAL_CAPSULE | Freq: Four times a day (QID) | ORAL | Status: DC | PRN

## 2020-04-20 MED ORDER — OXYCODONE-ACETAMINOPHEN 5-325 MG PO TABS: 1.0000 | ORAL_TABLET | ORAL | Status: DC | PRN

## 2020-04-20 MED ORDER — OXYTOCIN 10 UNIT/ML IJ SOLN
INTRAMUSCULAR | Status: AC
  Filled 2020-04-20: qty 2

## 2020-04-20 MED ORDER — OXYCODONE HCL 5 MG PO TABS
5.0000 mg | ORAL_TABLET | ORAL | Status: DC | PRN
  Administered 2020-04-20 (×2): 5 mg via ORAL
  Filled 2020-04-20 (×2): qty 1

## 2020-04-20 MED ORDER — AMMONIA AROMATIC IN INHA
RESPIRATORY_TRACT | Status: AC
  Filled 2020-04-20: qty 10

## 2020-04-20 MED ORDER — OXYCODONE HCL 5 MG PO TABS: 10.0000 mg | ORAL_TABLET | ORAL | Status: DC | PRN

## 2020-04-20 MED ORDER — TETANUS-DIPHTH-ACELL PERTUSSIS 5-2.5-18.5 LF-MCG/0.5 IM SUSP
0.5000 mL | Freq: Once | INTRAMUSCULAR | Status: DC
  Filled 2020-04-20: qty 0.5

## 2020-04-20 MED ORDER — DIBUCAINE (PERIANAL) 1 % EX OINT: 1.0000 "application " | TOPICAL_OINTMENT | CUTANEOUS | Status: DC | PRN

## 2020-04-20 MED ORDER — ACETAMINOPHEN 325 MG PO TABS
650.0000 mg | ORAL_TABLET | ORAL | Status: DC | PRN
  Administered 2020-04-20 – 2020-04-21 (×4): 650 mg via ORAL
  Filled 2020-04-20 (×3): qty 2

## 2020-04-20 MED ORDER — ONDANSETRON HCL 4 MG/2ML IJ SOLN: 4.0000 mg | Freq: Four times a day (QID) | INTRAMUSCULAR | Status: DC | PRN

## 2020-04-20 MED ORDER — OXYCODONE-ACETAMINOPHEN 5-325 MG PO TABS: 2.0000 | ORAL_TABLET | ORAL | Status: DC | PRN

## 2020-04-20 MED ORDER — PRENATAL MULTIVITAMIN CH
1.0000 | ORAL_TABLET | Freq: Every day | ORAL | Status: DC
  Administered 2020-04-20 – 2020-04-21 (×2): 1 via ORAL
  Filled 2020-04-20 (×2): qty 1

## 2020-04-20 MED ORDER — LACTATED RINGERS IV SOLN: 500.0000 mL | INTRAVENOUS | Status: DC | PRN

## 2020-04-20 MED ORDER — IBUPROFEN 600 MG PO TABS
600.0000 mg | ORAL_TABLET | Freq: Four times a day (QID) | ORAL | Status: DC
  Administered 2020-04-20 – 2020-04-22 (×8): 600 mg via ORAL
  Filled 2020-04-20 (×8): qty 1

## 2020-04-20 MED ORDER — SIMETHICONE 80 MG PO CHEW: 80.0000 mg | CHEWABLE_TABLET | ORAL | Status: DC | PRN

## 2020-04-20 MED ORDER — LIDOCAINE HCL (PF) 1 % IJ SOLN
30.0000 mL | INTRAMUSCULAR | Status: DC | PRN
  Filled 2020-04-20: qty 30

## 2020-04-20 MED ORDER — SOD CITRATE-CITRIC ACID 500-334 MG/5ML PO SOLN: 30.0000 mL | ORAL | Status: DC | PRN

## 2020-04-20 MED ORDER — ZOLPIDEM TARTRATE 5 MG PO TABS: 5.0000 mg | ORAL_TABLET | Freq: Every evening | ORAL | Status: DC | PRN

## 2020-04-20 MED ORDER — ONDANSETRON HCL 4 MG PO TABS: 4.0000 mg | ORAL_TABLET | ORAL | Status: DC | PRN

## 2020-04-20 NOTE — Lactation Note (Addendum)
This note was copied from a baby's chart. Lactation Consultation Note  Patient Name: Girl Matina Rodier Today's Date: 04/20/2020 Reason for consult: Initial assessment;Early term 37-38.6wks  When went into LDR4, baby was rooting.  Encouraged mom to put her back to the breast whenever she demonstrated feeding cues.  Mom willing.  Demonstrated hand expression of colostrum.  She ltched well and began strong, rhythmic sucking with occasional swallow.  Reviewed normal newborn stomach size, supply and demand, normal course of lactation and routine newborn feeding patterns.   Maternal Data Formula Feeding for Exclusion: No Has patient been taught Hand Expression?: Yes Does the patient have breastfeeding experience prior to this delivery?: Yes  Feeding Feeding Type: Breast Fed  LATCH Score Latch: Grasps breast easily, tongue down, lips flanged, rhythmical sucking.  Audible Swallowing: A few with stimulation  Type of Nipple: Everted at rest and after stimulation  Comfort (Breast/Nipple): Soft / non-tender  Hold (Positioning): No assistance needed to correctly position infant at breast.  LATCH Score: 9  Interventions Interventions: Breast feeding basics reviewed;Assisted with latch;Skin to skin;Breast massage;Hand express;Breast compression  Lactation Tools Discussed/Used WIC Program: Yes   Consult Status Consult Status: PRN    Louis Meckel 04/20/2020, 8:26 AM

## 2020-04-20 NOTE — Discharge Summary (Signed)
Obstetrical Discharge Summary  Patient Name: Monica Wilcox DOB: 28-Jul-1986 MRN: [id number]  Date of Admission: 04/20/2020 Date of Delivery: 04/20/20 Delivered by: Haroldine Laws Date of Discharge: 04/22/2020  Primary OB: [facility]  LMP:No LMP recorded (lmp unknown). EDC Estimated Date of Delivery: 04/28/20 Gestational Age at Delivery: [redacted]w[redacted]d   Antepartum complications:  1. Prepregnancy BMI 32 2. History of preeclampsia 2012 Admitting Diagnosis: Uterine contractions  Secondary Diagnosis: Patient Active Problem List   Diagnosis Date Noted  . NSVD (normal spontaneous vaginal delivery) 04/20/2020    Augmentation: N/A Complications: None  Intrapartum complications/course: see delivery notes Delivery Type: spontaneous vaginal delivery Anesthesia: none Placenta: spontaneous Laceration: 1st degree vaginal Episiotomy: none Newborn Data: Live born female  Birth Weight:   6#13 APGAR: 39, 9  Newborn Delivery   Birth date/time: 04/20/2020 05:49:00 Delivery type: Vaginal, Spontaneous      34yo G4P1021 at 39+4wks presenting with uterine contractions, SROM with MSAF.  She progressed to complete and pushed over an intact perineum and delivered the fetal head, followed promptly by the shoulders. She was in control the whole time, and the baby placed on the maternal abdomen. Delayed cord clamping and the FOB cut her cord, while she was skin to skin. The placenta delivered spontaneously and intact. 1st degree vaginal laceration noted and repaired. Mom and baby tolerated the procedure well.   Postpartum Procedures: none  Post partum course:  Patient had an uncomplicated postpartum course.  By time of discharge on PPD#2, her pain was controlled on oral pain medications; she had appropriate lochia and was ambulating, voiding without difficulty and tolerating regular diet.  She was deemed stable for discharge to home.    Discharge Physical Exam:  BP 128/79 (BP Location: Left Arm)    Pulse 66   Temp 98.9 F (37.2 C) (Oral)   Resp 18   Ht 5\' 1"  (1.549 m)   Wt 81.6 kg   LMP  (LMP Unknown)   SpO2 100%   Breastfeeding Unknown   BMI 34.01 kg/m   General: alert and no distress Pulm: normal respiratory effort Lochia: appropriate Perineum: minimal edema Abdomen: soft, NT Uterine Fundus: firm, below umbilicus Extremities: No evidence of DVT seen on physical exam. No lower extremity edema.  Edinburgh:  Edinburgh Postnatal Depression Scale Screening Tool 04/20/2020 04/20/2020  I have been able to laugh and see the funny side of things. 0 (No Data)  I have looked forward with enjoyment to things. 0 -  I have blamed myself unnecessarily when things went wrong. 0 -  I have been anxious or worried for no good reason. 0 -  I have felt scared or panicky for no good reason. 0 -  Things have been getting on top of me. 1 -  I have been so unhappy that I have had difficulty sleeping. 0 -  I have felt sad or miserable. 0 -  I have been so unhappy that I have been crying. 0 -  The thought of harming myself has occurred to me. 0 -  Edinburgh Postnatal Depression Scale Total 1 -     Labs: CBC Latest Ref Rng & Units 04/21/2020 04/20/2020 10/18/2019  WBC 4.0 - 10.5 K/uL 10.6(H) 9.9 10.0  Hemoglobin 12.0 - 15.0 g/dL 10/20/2019) 7.8(G 95.6  Hematocrit 36 - 46 % 27.9(L) 42.0 34.9(L)  Platelets 150 - 400 K/uL 200 245 234   O POS Hemoglobin  Date Value Ref Range Status  04/21/2020 9.7 (L) 12.0 - 15.0 g/dL Final  Comment:    REPEATED TO VERIFY   HCT  Date Value Ref Range Status  04/21/2020 27.9 (L) 36 - 46 % Final    Disposition: stable, discharge to home Baby Feeding: breastmilk andformula Baby Disposition: home with mom  Contraception: IUD  Prenatal Labs:  type/Rh O pos  Antibody screen neg  Rubella Immune  Varicella Immune  RPR NR  HBsAg Neg  HIV NR  GC neg  Chlamydia neg  Genetic screening negative  1 hour GTT 129  3 hour GTT n/a  GBS pos    Rh Immune  globulin given: n/a Rubella vaccine given: n/a Varicella vaccine given: n/a Tdap vaccine given in AP or PP setting: given 12/13/19 Flu vaccine given in AP or PP setting: given 02/07/20  Plan: Monica Wilcox was discharged to home in good condition. Follow-up appointment with delivering provider in 6 weeks.  Discharge Instructions: Per After Visit Summary. Activity: Advance as tolerated. Pelvic rest for 6 weeks.   Diet: Regular Discharge Medications: Allergies as of 04/22/2020   No Known Allergies     Medication List    STOP taking these medications   aspirin 81 MG chewable tablet   levonorgestrel 20 MCG/24HR IUD Commonly known as: MIRENA   meloxicam 15 MG tablet Commonly known as: MOBIC   solifenacin 5 MG tablet Commonly known as: VESICARE     TAKE these medications   acetaminophen 325 MG tablet Commonly known as: Tylenol Take 2 tablets (650 mg total) by mouth every 4 (four) hours as needed (for pain scale < 4).   docusate sodium 100 MG capsule Commonly known as: COLACE Take 1 capsule (100 mg total) by mouth 2 (two) times daily.   ibuprofen 600 MG tablet Commonly known as: ADVIL Take 1 tablet (600 mg total) by mouth every 6 (six) hours.   multivitamin-prenatal 27-0.8 MG Tabs tablet Take 1 tablet by mouth daily at 12 noon.      Outpatient follow up:   Follow-up Information    Haroldine Laws, CNM. Go on 06/03/2020.   Specialty: Certified Nurse Midwife Why: 6 week postpartum visit 8/30 at 10:45am Contact information: [address] [place] [address] [phone]               Signed: Randa Ngo, CNM  04/22/2020 10:25 AM

## 2020-04-20 NOTE — H&P (Addendum)
OB History & Physical   History of Present Illness:  Chief Complaint:   HPI:  Monica Wilcox is a 34 y.o. G1P0 female at [redacted]w[redacted]d dated by LMP.  She presents to L&D for uterine contractions  She reports:  -active fetal movement -SROM at 0420, green fluid -no vaginal bleeding -onset of contractions at 0340 currently every 2-3 minutes  Pregnancy Issues: 1. Prepregnancy BMI 32 2. History of preeclampsia 2012   Maternal Medical History:   Past Medical History:  Diagnosis Date  . Medical history non-contributory     History reviewed. No pertinent surgical history.  No Known Allergies  Prior to Admission medications   Medication Sig Start Date End Date Taking? Authorizing Provider  levonorgestrel (MIRENA) 20 MCG/24HR IUD by Intrauterine route.    [provider]  meloxicam (MOBIC) 15 MG tablet Take 1 tablet (15 mg total) by mouth daily. 09/06/17   Alfredo Martinez, MD  solifenacin (VESICARE) 5 MG tablet Take 1 tablet (5 mg total) by mouth daily. 09/30/17   Riki Altes, MD     Prenatal care site: [facility]    Social History: She  reports that she has never smoked. She has never used smokeless tobacco. She reports that she does not drink alcohol and does not use drugs.  Family History: family history is not on file.   Review of Systems: A full review of systems was performed and negative except as noted in the HPI.    Physical Exam:  Vital Signs: BP (!) 137/94 (BP Location: Right Arm)   Pulse 71   Temp 98.1 F (36.7 C) (Oral)   Resp 18   Ht 5\' 1"  (1.549 m)   Wt 81.6 kg   LMP  (LMP Unknown)   BMI 34.01 kg/m   General:   alert and cooperative  Skin:  normal  Neurologic:    Alert & oriented x 3  Lungs:   nl effort  Heart:   regular rate and rhythm  Abdomen:  normal findings: soft, non-tender  Extremities: : non-tender, symmetric      Pertinent Results:  Prenatal Labs: Blood type/Rh O pos  Antibody screen neg  Rubella Immune  Varicella  Immune  RPR NR  HBsAg Neg  HIV NR  GC neg  Chlamydia neg  Genetic screening negative  1 hour GTT 129  3 hour GTT n/a  GBS pos   FHT: FHR: 130 bpm, variability: moderate,  accelerations:  Present,  decelerations:  Absent Category/reactivity:  Category I TOCO: regular, every 2-3 minutes SVE: Dilation: 4.5 / Effacement (%): 100 / Station: -1, 0    Cephalic by SVE   Assessment:  Monica Wilcox is a 35 y.o. G1P0 female at [redacted]w[redacted]d with uterine contractions.   Plan:  1. Admit to Labor & Delivery; consents reviewed and obtained  2. Fetal Well being  - Fetal Tracing: Cat I - GBS pos - Presentation: vtx confirmed by SVE  - MSAF present  3. Routine OB: - Prenatal labs reviewed, as above - Rh pos - CBC & T&S on admit - Clear fluids, IVF  4. Monitoring of Labor -  Contractions external toco in place -  Plan for continuous fetal monitoring  -  Maternal pain control as desired: IVPM, regional anesthesia - Anticipate vaginal delivery  5. Post Partum Planning: - Infant feeding: Both - Contraception: IUD - Flu - given 12/13/2019  - Tdap - Given 02/07/20   04/08/20, CNM 04/20/2020 5:00 AM

## 2020-04-20 NOTE — Lactation Note (Signed)
This note was copied from a baby's chart. Lactation Consultation Note  Patient Name: [name] Today's Date: 04/20/2020 Reason for consult: Follow-up assessment  Layla had been breast feeding excellent until got bottle of formula.  Now she will go to the breast, take a few sucks and then fall asleep.  Discouraged mom from giving any more bottles until breastfeeding is well established to discouraged nipple confusion and fast flow of bottle.  Encouraged mom to put her to the breast whenever she demonstrates feeding cues.  Explained supply and demand again encouraging mom to put her to the breast frequently to bring in mature milk and ensure a plentiful milk supply.  Lactation name and number written on white board and encouraged to call with any questions, concerns or assistance. Maternal Data    Feeding Feeding Type: Breast Fed  LATCH Score                   Interventions    Lactation Tools Discussed/Used     Consult Status      Monica Wilcox 04/20/2020, 9:06 PM

## 2020-04-21 LAB — CBC
HCT: 27.9 % — ABNORMAL LOW (ref 36.0–46.0)
Hemoglobin: 9.7 g/dL — ABNORMAL LOW (ref 12.0–15.0)
MCH: 28.9 pg (ref 26.0–34.0)
MCHC: 34.8 g/dL (ref 30.0–36.0)
MCV: 83 fL (ref 80.0–100.0)
Platelets: 200 10*3/uL (ref 150–400)
RBC: 3.36 MIL/uL — ABNORMAL LOW (ref 3.87–5.11)
RDW: 14 % (ref 11.5–15.5)
WBC: 10.6 10*3/uL — ABNORMAL HIGH (ref 4.0–10.5)
nRBC: 0 % (ref 0.0–0.2)

## 2020-04-21 MED ORDER — POLYETHYLENE GLYCOL 3350 17 G PO PACK
17.0000 g | PACK | Freq: Every day | ORAL | Status: DC | PRN
  Administered 2020-04-21: 17 g via ORAL
  Filled 2020-04-21 (×2): qty 1

## 2020-04-21 NOTE — Progress Notes (Signed)
Post Partum Day 1  Subjective: Doing well, no concerns. Ambulating without difficulty, pain managed with PO meds, tolerating regular diet, and voiding without difficulty.   No fever/chills, chest pain, shortness of breath, nausea/vomiting, or leg pain. No nipple or breast pain.   Objective: BP 116/73   Pulse 61   Temp 98.6 F (37 C) (Oral)   Resp 20   Ht 5\' 1"  (1.549 m)   Wt 81.6 kg   LMP  (LMP Unknown)   SpO2 100%   Breastfeeding Unknown   BMI 34.01 kg/m    Physical Exam:  General: alert and cooperative Breasts: soft/nontender CV: RRR Pulm: nl effort Abdomen: soft, non-tender Uterine Fundus: firm Incision: n/a Perineum: minimal edema, repair well approximated Lochia: appropriate DVT Evaluation: No evidence of DVT seen on physical exam. Edinburgh:  Edinburgh Postnatal Depression Scale Screening Tool 04/20/2020 04/20/2020  I have been able to laugh and see the funny side of things. 0 (No Data)  I have looked forward with enjoyment to things. 0 -  I have blamed myself unnecessarily when things went wrong. 0 -  I have been anxious or worried for no good reason. 0 -  I have felt scared or panicky for no good reason. 0 -  Things have been getting on top of me. 1 -  I have been so unhappy that I have had difficulty sleeping. 0 -  I have felt sad or miserable. 0 -  I have been so unhappy that I have been crying. 0 -  The thought of harming myself has occurred to me. 0 -  Edinburgh Postnatal Depression Scale Total 1 -     Recent Labs    04/20/20 0440 04/21/20 0627  HGB 14.3 9.7*  HCT 42.0 27.9*  WBC 9.9 10.6*  PLT 245 200    Assessment/Plan: 34 y.o. G3P3003 postpartum day # 1  -Continue routine postpartum care -Lactation consult PRN for breastfeeding   -Acute blood loss anemia - hemodynamically stable and asymptomatic; start PO ferrous sulfate BID with stool softeners  -Constipation - Miralax given -Immunization status: all immunizations up to  date  Disposition: Continue inpatient postpartum care, Discharge home d/t inadequate GBS treatment   LOS: 1 day   Leaf [employer], CNM 04/21/2020, 1:28 PM

## 2020-04-22 MED ORDER — ACETAMINOPHEN 325 MG PO TABS: 650.0000 mg | ORAL_TABLET | ORAL | 0 refills | Status: AC | PRN

## 2020-04-22 MED ORDER — IBUPROFEN 600 MG PO TABS: 600.0000 mg | ORAL_TABLET | Freq: Four times a day (QID) | ORAL | 0 refills | Status: AC

## 2020-04-22 MED ORDER — DOCUSATE SODIUM 100 MG PO CAPS: 100.0000 mg | ORAL_CAPSULE | Freq: Two times a day (BID) | ORAL | 0 refills | Status: AC

## 2020-04-22 NOTE — Discharge Instructions (Signed)
Postpartum Care After Vaginal Delivery This sheet gives you information about how to care for yourself from the time you deliver your baby to up to 6-12 weeks after delivery (postpartum period). Your health care provider may also give you more specific instructions. If you have problems or questions, contact your health care provider. Follow these instructions at home: Vaginal bleeding  It is normal to have vaginal bleeding (lochia) after delivery. Wear a sanitary pad for vaginal bleeding and discharge. ? During the first week after delivery, the amount and appearance of lochia is often similar to a menstrual period. ? Over the next few weeks, it will gradually decrease to a dry, yellow-brown discharge. ? For most women, lochia stops completely by 4-6 weeks after delivery. Vaginal bleeding can vary from woman to woman.  Change your sanitary pads frequently. Watch for any changes in your flow, such as: ? A sudden increase in volume. ? A change in color. ? Large blood clots.  If you pass a blood clot from your vagina, save it and call your health care provider to discuss. Do not flush blood clots down the toilet before talking with your health care provider.  Do not use tampons or douches until your health care provider says this is safe.  If you are not breastfeeding, your period should return 6-8 weeks after delivery. If you are feeding your child breast milk only (exclusive breastfeeding), your period may not return until you stop breastfeeding. Perineal care  Keep the area between the vagina and the anus (perineum) clean and dry as told by your health care provider. Use medicated pads and pain-relieving sprays and creams as directed.  If you had a cut in the perineum (episiotomy) or a tear in the vagina, check the area for signs of infection until you are healed. Check for: ? More redness, swelling, or pain. ? Fluid or blood coming from the cut or tear. ? Warmth. ? Pus or a bad  smell.  You may be given a squirt bottle to use instead of wiping to clean the perineum area after you go to the bathroom. As you start healing, you may use the squirt bottle before wiping yourself. Make sure to wipe gently.  To relieve pain caused by an episiotomy, a tear in the vagina, or swollen veins in the anus (hemorrhoids), try taking a warm sitz bath 2-3 times a day. A sitz bath is a warm water bath that is taken while you are sitting down. The water should only come up to your hips and should cover your buttocks. Breast care  Within the first few days after delivery, your breasts may feel heavy, full, and uncomfortable (breast engorgement). Milk may also leak from your breasts. Your health care provider can suggest ways to help relieve the discomfort. Breast engorgement should go away within a few days.  If you are breastfeeding: ? Wear a bra that supports your breasts and fits you well. ? Keep your nipples clean and dry. Apply creams and ointments as told by your health care provider. ? You may need to use breast pads to absorb milk that leaks from your breasts. ? You may have uterine contractions every time you breastfeed for up to several weeks after delivery. Uterine contractions help your uterus return to its normal size. ? If you have any problems with breastfeeding, work with your health care provider or lactation consultant.  If you are not breastfeeding: ? Avoid touching your breasts a lot. Doing this can make   your breasts produce more milk. ? Wear a good-fitting bra and use cold packs to help with swelling. ? Do not squeeze out (express) milk. This causes you to make more milk. Intimacy and sexuality  Ask your health care provider when you can engage in sexual activity. This may depend on: ? Your risk of infection. ? How fast you are healing. ? Your comfort and desire to engage in sexual activity.  You are able to get pregnant after delivery, even if you have not had  your period. If desired, talk with your health care provider about methods of birth control (contraception). Medicines  Take over-the-counter and prescription medicines only as told by your health care provider.  If you were prescribed an antibiotic medicine, take it as told by your health care provider. Do not stop taking the antibiotic even if you start to feel better. Activity  Gradually return to your normal activities as told by your health care provider. Ask your health care provider what activities are safe for you.  Rest as much as possible. Try to rest or take a nap while your baby is sleeping. Eating and drinking   Drink enough fluid to keep your urine pale yellow.  Eat high-fiber foods every day. These may help prevent or relieve constipation. High-fiber foods include: ? Whole grain cereals and breads. ? Brown rice. ? Beans. ? Fresh fruits and vegetables.  Do not try to lose weight quickly by cutting back on calories.  Take your prenatal vitamins until your postpartum checkup or until your health care provider tells you it is okay to stop. Lifestyle  Do not use any products that contain nicotine or tobacco, such as cigarettes and e-cigarettes. If you need help quitting, ask your health care provider.  Do not drink alcohol, especially if you are breastfeeding. General instructions  Keep all follow-up visits for you and your baby as told by your health care provider. Most women visit their health care provider for a postpartum checkup within the first 3-6 weeks after delivery. Contact a health care provider if:  You feel unable to cope with the changes that your child brings to your life, and these feelings do not go away.  You feel unusually sad or worried.  Your breasts become red, painful, or hard.  You have a fever.  You have trouble holding urine or keeping urine from leaking.  You have little or no interest in activities you used to enjoy.  You have not  breastfed at all and you have not had a menstrual period for 12 weeks after delivery.  You have stopped breastfeeding and you have not had a menstrual period for 12 weeks after you stopped breastfeeding.  You have questions about caring for yourself or your baby.  You pass a blood clot from your vagina. Get help right away if:  You have chest pain.  You have difficulty breathing.  You have sudden, severe leg pain.  You have severe pain or cramping in your lower abdomen.  You bleed from your vagina so much that you fill more than one sanitary pad in one hour. Bleeding should not be heavier than your heaviest period.  You develop a severe headache.  You faint.  You have blurred vision or spots in your vision.  You have bad-smelling vaginal discharge.  You have thoughts about hurting yourself or your baby. If you ever feel like you may hurt yourself or others, or have thoughts about taking your own life, get help  vagina so much that you fill more than one sanitary pad in one hour. Bleeding should not be heavier than your heaviest period.  · You develop a severe headache.  · You faint.  · You have blurred vision or spots in your vision.  · You have bad-smelling vaginal discharge.  · You have thoughts about hurting yourself or your baby.  If you ever feel like you may hurt yourself or others, or have thoughts about taking your own life, get help right away. You can go to the nearest emergency department or call:  · Your local emergency services (911 in the [place]  · A suicide crisis helpline, such as the National Suicide Prevention Lifeline at 1-800-273-8255. This is open 24 hours a day.  Summary  · The period of time right after you deliver your newborn up to 6-12 weeks after delivery is called the postpartum period.  · Gradually return to your normal activities as told by your health care provider.  · Keep all follow-up visits for you and your baby as told by your health care provider.  This information is not intended to replace advice given to you by your health care provider. Make sure you discuss any questions you have with your health care provider.  Document Revised: 09/24/2017 Document Reviewed: 07/05/2017  Elsevier Patient Education © 2020 [employer]  Breastfeeding    Choosing to breastfeed is one of the best decisions you can make for yourself and your baby. A change in hormones during pregnancy causes your  breasts to make breast milk in your milk-producing glands. Hormones prevent breast milk from being released before your baby is born. They also prompt milk flow after birth. Once breastfeeding has begun, thoughts of your baby, as well as his or her sucking or crying, can stimulate the release of milk from your milk-producing glands.  Benefits of breastfeeding  Research shows that breastfeeding offers many health benefits for infants and mothers. It also offers a cost-free and convenient way to feed your baby.  For your baby  · Your first milk (colostrum) helps your baby's digestive system to function better.  · Special cells in your milk (antibodies) help your baby to fight off infections.  · Breastfed babies are less likely to develop asthma, allergies, obesity, or type 2 diabetes. They are also at lower risk for sudden infant death syndrome (SIDS).  · Nutrients in breast milk are better able to meet your baby’s needs compared to infant formula.  · Breast milk improves your baby's brain development.  For you  · Breastfeeding helps to create a very special bond between you and your baby.  · Breastfeeding is convenient. Breast milk costs nothing and is always available at the correct temperature.  · Breastfeeding helps to burn calories. It helps you to lose the weight that you gained during pregnancy.  · Breastfeeding makes your uterus return faster to its size before pregnancy. It also slows bleeding (lochia) after you give birth.  · Breastfeeding helps to lower your risk of developing type 2 diabetes, osteoporosis, rheumatoid arthritis, cardiovascular disease, and breast, ovarian, uterine, and endometrial cancer later in life.  Breastfeeding basics  Starting breastfeeding  · Find a comfortable place to sit or lie down, with your neck and back well-supported.  · Place a pillow or a rolled-up blanket under your baby to bring him or her to the level of your breast (if you are seated). Nursing pillows are specially  designed to help support your arms   and your baby while you breastfeed.  · Make sure that your baby's tummy (abdomen) is facing your abdomen.  · Gently massage your breast. With your fingertips, massage from the outer edges of your breast inward toward the nipple. This encourages milk flow. If your milk flows slowly, you may need to continue this action during the feeding.  · Support your breast with 4 fingers underneath and your thumb above your nipple (make the letter "C" with your hand). Make sure your fingers are well away from your nipple and your baby’s mouth.  · Stroke your baby's lips gently with your finger or nipple.  · When your baby's mouth is open wide enough, quickly bring your baby to your breast, placing your entire nipple and as much of the areola as possible into your baby's mouth. The areola is the colored area around your nipple.  ? More areola should be visible above your baby's upper lip than below the lower lip.  ? Your baby's lips should be opened and extended outward (flanged) to ensure an adequate, comfortable latch.  ? Your baby's tongue should be between his or her lower gum and your breast.  · Make sure that your baby's mouth is correctly positioned around your nipple (latched). Your baby's lips should create a seal on your breast and be turned out (everted).  · It is common for your baby to suck about 2-3 minutes in order to start the flow of breast milk.  Latching  Teaching your baby how to latch onto your breast properly is very important. An improper latch can cause nipple pain, decreased milk supply, and poor weight gain in your baby. Also, if your baby is not latched onto your nipple properly, he or she may swallow some air during feeding. This can make your baby fussy. Burping your baby when you switch breasts during the feeding can help to get rid of the air. However, teaching your baby to latch on properly is still the best way to prevent fussiness from swallowing air while  breastfeeding.  Signs that your baby has successfully latched onto your nipple  · Silent tugging or silent sucking, without causing you pain. Infant's lips should be extended outward (flanged).  · Swallowing heard between every 3-4 sucks once your milk has started to flow (after your let-down milk reflex occurs).  · Muscle movement above and in front of his or her ears while sucking.  Signs that your baby has not successfully latched onto your nipple  · Sucking sounds or smacking sounds from your baby while breastfeeding.  · Nipple pain.  If you think your baby has not latched on correctly, slip your finger into the corner of your baby’s mouth to break the suction and place it between your baby's gums. Attempt to start breastfeeding again.  Signs of successful breastfeeding  Signs from your baby  · Your baby will gradually decrease the number of sucks or will completely stop sucking.  · Your baby will fall asleep.  · Your baby's body will relax.  · Your baby will retain a small amount of milk in his or her mouth.  · Your baby will let go of your breast by himself or herself.  Signs from you  · Breasts that have increased in firmness, weight, and size 1-3 hours after feeding.  · Breasts that are softer immediately after breastfeeding.  · Increased milk volume, as well as a change in milk consistency and color by the fifth day of breastfeeding.  ·   Nipples that are not sore, cracked, or bleeding.  Signs that your baby is getting enough milk  · Wetting at least 1-2 diapers during the first 24 hours after birth.  · Wetting at least 5-6 diapers every 24 hours for the first week after birth. The urine should be clear or pale yellow by the age of 5 days.  · Wetting 6-8 diapers every 24 hours as your baby continues to grow and develop.  · At least 3 stools in a 24-hour period by the age of 5 days. The stool should be soft and yellow.  · At least 3 stools in a 24-hour period by the age of 7 days. The stool should be seedy and  yellow.  · No loss of weight greater than 10% of birth weight during the first 3 days of life.  · Average weight gain of 4-7 oz (113-198 g) per week after the age of 4 days.  · Consistent daily weight gain by the age of 5 days, without weight loss after the age of 2 weeks.  After a feeding, your baby may spit up a small amount of milk. This is normal.  Breastfeeding frequency and duration  Frequent feeding will help you make more milk and can prevent sore nipples and extremely full breasts (breast engorgement). Breastfeed when you feel the need to reduce the fullness of your breasts or when your baby shows signs of hunger. This is called "breastfeeding on demand." Signs that your baby is hungry include:  · Increased alertness, activity, or restlessness.  · Movement of the head from side to side.  · Opening of the mouth when the corner of the mouth or cheek is stroked (rooting).  · Increased sucking sounds, smacking lips, cooing, sighing, or squeaking.  · Hand-to-mouth movements and sucking on fingers or hands.  · Fussing or crying.  Avoid introducing a pacifier to your baby in the first 4-6 weeks after your baby is born. After this time, you may choose to use a pacifier. Research has shown that pacifier use during the first year of a baby's life decreases the risk of sudden infant death syndrome (SIDS).  Allow your baby to feed on each breast as long as he or she wants. When your baby unlatches or falls asleep while feeding from the first breast, offer the second breast. Because newborns are often sleepy in the first few weeks of life, you may need to awaken your baby to get him or her to feed.  Breastfeeding times will vary from baby to baby. However, the following rules can serve as a guide to help you make sure that your baby is properly fed:  · Newborns (babies 4 weeks of age or younger) may breastfeed every 1-3 hours.  · Newborns should not go without breastfeeding for longer than 3 hours during the day or 5  hours during the night.  · You should breastfeed your baby a minimum of 8 times in a 24-hour period.  Breast milk pumping         Pumping and storing breast milk allows you to make sure that your baby is exclusively fed your breast milk, even at times when you are unable to breastfeed. This is especially important if you go back to work while you are still breastfeeding, or if you are not able to be present during feedings. Your lactation consultant can help you find a method of pumping that works best for you and give you guidelines about how long   after you give birth. The following recommendations can help to ease engorgement:  Completely empty your breasts while breastfeeding or pumping. You may want to start by applying warm, moist heat (in the shower or with warm, water-soaked hand towels) just before feeding or pumping. This increases circulation and helps the  milk flow. If your baby does not completely empty your breasts while breastfeeding, pump any extra milk after he or she is finished.  Apply ice packs to your breasts immediately after breastfeeding or pumping, unless this is too uncomfortable for you. To do this: ? Put ice in a plastic bag. ? Place a towel between your skin and the bag. ? Leave the ice on for 20 minutes, 2-3 times a day.  Make sure that your baby is latched on and positioned properly while breastfeeding. If engorgement persists after 48 hours of following these recommendations, contact your health care provider or a Advertising copywriter. Overall health care recommendations while breastfeeding  Eat 3 healthy meals and 3 snacks every day. Well-nourished mothers who are breastfeeding need an additional 450-500 calories a day. You can meet this requirement by increasing the amount of a balanced diet that you eat.  Drink enough water to keep your urine pale yellow or clear.  Rest often, relax, and continue to take your prenatal vitamins to prevent fatigue, stress, and low vitamin and mineral levels in your body (nutrient deficiencies).  Do not use any products that contain nicotine or tobacco, such as cigarettes and e-cigarettes. Your baby may be harmed by chemicals from cigarettes that pass into breast milk and exposure to secondhand smoke. If you need help quitting, ask your health care provider.  Avoid alcohol.  Do not use illegal drugs or marijuana.  Talk with your health care provider before taking any medicines. These include over-the-counter and prescription medicines as well as vitamins and herbal supplements. Some medicines that may be harmful to your baby can pass through breast milk.  It is possible to become pregnant while breastfeeding. If birth control is desired, ask your health care provider about options that will be safe while breastfeeding your baby. Where to find more information: [employer]: www.llli.org Contact a health care provider if:  You feel like you want to stop breastfeeding or have become frustrated with breastfeeding.  Your nipples are cracked or bleeding.  Your breasts are red, tender, or warm.  You have: ? Painful breasts or nipples. ? A swollen area on either breast. ? A fever or chills. ? Nausea or vomiting. ? Drainage other than breast milk from your nipples.  Your breasts do not become full before feedings by the fifth day after you give birth.  You feel sad and depressed.  Your baby is: ? Too sleepy to eat well. ? Having trouble sleeping. ? More than 72 week old and wetting fewer than 6 diapers in a 24-hour period. ? Not gaining weight by 49 days of age.  Your baby has fewer than 3 stools in a 24-hour period.  Your baby's skin or the white parts of his or her eyes become yellow. Get help right away if:  Your baby is overly tired (lethargic) and does not want to wake up and feed.  Your baby develops an unexplained fever. Summary  Breastfeeding offers many health benefits for infant and mothers.  Try to breastfeed your infant when he or she shows early signs of hunger.  Gently tickle or stroke your baby's lips with your finger or nipple  to allow the baby to open his or her mouth. Bring the baby to your breast. Make sure that much of the areola is in your baby's mouth. Offer one side and burp the baby before you offer the other side.  Talk with your health care provider or lactation consultant if you have questions or you face problems as you breastfeed. This information is not intended to replace advice given to you by your health care provider. Make sure you discuss any questions you have with your health care provider. Document Revised: 12/16/2017 Document Reviewed: 10/23/2016 Elsevier Patient Education  2020 [employer].   Postpartum Baby Blues The postpartum period begins right after the birth of a baby. During this  time, there is often a lot of joy and excitement. It is also a time of many changes in the life of the parents. No matter how many times a mother gives birth, each child brings new challenges to the family, including different ways of relating to one another. It is common to have feelings of excitement along with confusing changes in moods, emotions, and thoughts. You may feel happy one minute and sad or stressed the next. These feelings of sadness usually happen in the period right after you have your baby, and they go away within a week or two. This is called the "baby blues." What are the causes? There is no known cause of baby blues. It is likely caused by a combination of factors. However, changes in hormone levels after childbirth are believed to trigger some of the symptoms. Other factors that can play a role in these mood changes include:  Lack of sleep.  Stressful life events, such as poverty, caring for a loved one, or death of a loved one.  Genetics. What are the signs or symptoms? Symptoms of this condition include:  Brief changes in mood, such as going from extreme happiness to sadness.  Decreased concentration.  Difficulty sleeping.  Crying spells and tearfulness.  Loss of appetite.  Irritability.  Anxiety. If the symptoms of baby blues last for more than 2 weeks or become more severe, you may have postpartum depression. How is this diagnosed? This condition is diagnosed based on an evaluation of your symptoms. There are no medical or lab tests that lead to a diagnosis, but there are various questionnaires that a health care provider may use to identify women with the baby blues or postpartum depression. How is this treated? Treatment is not needed for this condition. The baby blues usually go away on their own in 1-2 weeks. Social support is often all that is needed. You will be encouraged to get adequate sleep and rest. Follow these instructions at home: Lifestyle       Get as much rest as you can. Take a nap when the baby sleeps.  Exercise regularly as told by your health care provider. Some women find yoga and walking to be helpful.  Eat a balanced and nourishing diet. This includes plenty of fruits and vegetables, whole grains, and lean proteins.  Do little things that you enjoy. Have a cup of tea, take a bubble bath, read your favorite magazine, or listen to your favorite music.  Avoid alcohol.  Ask for help with household chores, cooking, grocery shopping, or running errands. Do not try to do everything yourself. Consider hiring a postpartum doula to help. This is a professional who specializes in providing support to new mothers.  Try not to make any major life changes during pregnancy  or right after giving birth. This can add stress. General instructions  Talk to people close to you about how you are feeling. Get support from your partner, family members, friends, or other new moms. You may want to join a support group.  Find ways to cope with stress. This may include: ? Writing your thoughts and feelings in a journal. ? Spending time outside. ? Spending time with people who make you laugh.  Try to stay positive in how you think. Think about the things you are grateful for.  Take over-the-counter and prescription medicines only as told by your health care provider.  Let your health care provider know if you have any concerns.  Keep all postpartum visits as told by your health care provider. This is important. Contact a health care provider if:  Your baby blues do not go away after 2 weeks. Get help right away if:  You have thoughts of taking your own life (suicidal thoughts).  You think you may harm the baby or other people.  You see or hear things that are not there (hallucinations). Summary  After giving birth, you may feel happy one minute and sad or stressed the next. Feelings of sadness that happen right after the baby is  born and go away after a week or two are called the "baby blues."  You can manage the baby blues by getting enough rest, eating a healthy diet, exercising, spending time with supportive people, and finding ways to cope with stress.  If feelings of sadness and stress last longer than 2 weeks or get in the way of caring for your baby, talk to your health care provider. This may mean you have postpartum depression. This information is not intended to replace advice given to you by your health care provider. Make sure you discuss any questions you have with your health care provider. Document Revised: 01/13/2019 Document Reviewed: 11/17/2016 Elsevier Patient Education  2020 [employer].

## 2020-04-22 NOTE — Progress Notes (Signed)
Pt discharged with infant. Discharge instructions, prescriptions, and follow up appointments given to and reviewed with patient. Pt verbalized understanding. Escorted out by auxillary.  

## 2021-09-18 ENCOUNTER — Institutional Professional Consult (permissible substitution): Payer: Medicaid Other | Admitting: Plastic Surgery

## 2021-11-28 ENCOUNTER — Encounter: Payer: Self-pay | Admitting: Plastic Surgery

## 2021-11-28 ENCOUNTER — Other Ambulatory Visit: Payer: Self-pay

## 2021-11-28 ENCOUNTER — Ambulatory Visit: Payer: Medicaid Other | Admitting: Plastic Surgery

## 2021-11-28 DIAGNOSIS — M546 Pain in thoracic spine: Secondary | ICD-10-CM | POA: Diagnosis not present

## 2021-11-28 DIAGNOSIS — N62 Hypertrophy of breast: Secondary | ICD-10-CM | POA: Diagnosis not present

## 2021-11-28 DIAGNOSIS — M549 Dorsalgia, unspecified: Secondary | ICD-10-CM | POA: Insufficient documentation

## 2021-11-28 DIAGNOSIS — G8929 Other chronic pain: Secondary | ICD-10-CM

## 2021-11-28 DIAGNOSIS — M542 Cervicalgia: Secondary | ICD-10-CM | POA: Insufficient documentation

## 2021-11-28 NOTE — Progress Notes (Signed)
Patient ID: Monica Wilcox, female    DOB: 1986/09/20, 36 y.o.   MRN: [id number]   Chief Complaint  Patient presents with   consult   Breast Problem    Mammary Hyperplasia: The patient is a 36 y.o. female with a history of mammary hyperplasia for several years.  She has extremely large breasts causing symptoms that include the following: Back pain in the upper and lower back, including neck pain. She pulls or pins her bra straps to provide better lift and relief of the pressure and pain. She notices relief by holding her breast up manually.  Her shoulder straps cause grooves and pain and pressure that requires padding for relief. Pain medication is sometimes required with motrin and tylenol.  Activities that are hindered by enlarged breasts include: exercise and running.  She has tried supportive clothing as well as fitted bras without improvement.  Her breasts are extremely large and fairly symmetric.  She has hyperpigmentation of the inframammary area on both sides.  The sternal to nipple distance on the right is 29 cm and the left is 29 cm.  The IMF distance is 15 cm.  She is 4 feet 11 inches tall and weighs 161 pounds.  The BMI = 32.5 kg/m.  Preoperative bra size = 38 D cup. She would like to be a C cup.  The estimated excess breast tissue to be removed at the time of surgery = 325-350 grams on the left and 325-350 grams on the right.  Mammogram history: none.  Family history of breast cancer:  none.  Tobacco use:  none.   The patient expresses the desire to pursue surgical intervention. She has two children and never had surgery.    Review of Systems  Constitutional: Negative.   HENT: Negative.    Eyes: Negative.   Respiratory: Negative.    Cardiovascular: Negative.   Gastrointestinal: Negative.   Endocrine: Negative.   Genitourinary: Negative.   Musculoskeletal:  Positive for back pain and neck pain.  Skin: Negative.   Hematological: Negative.   Psychiatric/Behavioral:  Negative.     Past Medical History:  Diagnosis Date   Medical history non-contributory     History reviewed. No pertinent surgical history.    Current Outpatient Medications:    acetaminophen (TYLENOL) 325 MG tablet, Take 2 tablets (650 mg total) by mouth every 4 (four) hours as needed (for pain scale < 4)., Disp: 60 tablet, Rfl: 0   docusate sodium (COLACE) 100 MG capsule, Take 1 capsule (100 mg total) by mouth 2 (two) times daily., Disp: 30 capsule, Rfl: 0   ibuprofen (ADVIL) 600 MG tablet, Take 1 tablet (600 mg total) by mouth every 6 (six) hours., Disp: 30 tablet, Rfl: 0   Prenatal Vit-Fe Fumarate-FA (MULTIVITAMIN-PRENATAL) 27-0.8 MG TABS tablet, Take 1 tablet by mouth daily at 12 noon., Disp: , Rfl:    Objective:   Vitals:   11/28/21 0851  BP: 122/84  Pulse: 68  SpO2: 98%    Physical Exam Vitals reviewed.  Constitutional:      Appearance: Normal appearance.  HENT:     Head: Normocephalic and atraumatic.  Cardiovascular:     Rate and Rhythm: Normal rate.     Pulses: Normal pulses.  Pulmonary:     Effort: Pulmonary effort is normal. No respiratory distress.  Abdominal:     General: There is no distension.     Palpations: Abdomen is soft.     Tenderness: There is no abdominal tenderness.  Hernia: No hernia is present.  Skin:    General: Skin is warm.     Capillary Refill: Capillary refill takes less than 2 seconds.     Coloration: Skin is not jaundiced.     Findings: No bruising.  Neurological:     Mental Status: She is alert and oriented to person, place, and time.  Psychiatric:        Mood and Affect: Mood normal.        Behavior: Behavior normal.        Thought Content: Thought content normal.        Judgment: Judgment normal.    Assessment & Plan:  Chronic bilateral thoracic back pain  Neck pain  Symptomatic mammary hypertrophy  The procedure the patient selected and that was best for the patient was discussed. The risk were discussed and include  but not limited to the following:  Breast asymmetry, fluid accumulation, firmness of the breast, inability to breast feed, loss of nipple or areola, skin loss, change in skin and nipple sensation, fat necrosis of the breast tissue, bleeding, infection and healing delay.  There are risks of anesthesia and injury to nerves or blood vessels.  Allergic reaction to tape, suture and skin glue are possible.  There will be swelling.  Any of these can lead to the need for revisional surgery.  A breast reduction has potential to interfere with diagnostic procedures in the future.  This procedure is best done when the breast is fully developed.  Changes in the breast will continue to occur over time: pregnancy, weight gain or weigh loss.    Total time: 45 minutes. This includes time spent with the patient during the visit as well as time spent before and after the visit reviewing the chart, documenting the encounter, ordering pertinent studies and literature for the patient.   Physical therapy:  done and requested records Mammogram:  not indicated but offered and declined  Plan for bilateral breast reduction with possible liposuction. Need PT report.  Pictures were obtained of the patient and placed in the chart with the patient's or guardian's permission.   Alena Bills Naji Mehringer, DO

## 2021-12-01 ENCOUNTER — Telehealth: Payer: Self-pay

## 2021-12-01 NOTE — Telephone Encounter (Signed)
Faxed referral to [facility] °

## 2022-01-13 ENCOUNTER — Telehealth: Payer: Self-pay | Admitting: Plastic Surgery

## 2022-01-13 NOTE — Telephone Encounter (Signed)
Called patient using interpreter,Monica Wilcox, to advise that insurance denied surgery due to the fact she has not had any trauma to her breast and no breast disease. Patient would like to receive a quote regarding surgery via mail. Patient confirmed address. Quote sent.  ?

## 2022-02-13 ENCOUNTER — Other Ambulatory Visit: Payer: Self-pay | Admitting: Family Medicine

## 2022-02-13 DIAGNOSIS — G8929 Other chronic pain: Secondary | ICD-10-CM

## 2022-02-13 DIAGNOSIS — M546 Pain in thoracic spine: Secondary | ICD-10-CM

## 2022-02-23 ENCOUNTER — Ambulatory Visit
Admission: RE | Admit: 2022-02-23 | Discharge: 2022-02-23 | Disposition: A | Discharge status: Home / Self Care | Payer: Medicaid Other | Source: Ambulatory Visit | Attending: Family Medicine | Admitting: Family Medicine

## 2022-02-23 DIAGNOSIS — M546 Pain in thoracic spine: Secondary | ICD-10-CM

## 2022-02-23 DIAGNOSIS — G8929 Other chronic pain: Secondary | ICD-10-CM
